# Patient Record
Sex: Male | Born: 1990 | Hispanic: No | Marital: Married | Smoking: Never smoker
Health system: Southern US, Community
[De-identification: ages and names within clinical notes are randomized; demographics above are authoritative.]

## PROBLEM LIST (undated history)

## (undated) DIAGNOSIS — E785 Hyperlipidemia, unspecified: Secondary | ICD-10-CM

## (undated) DIAGNOSIS — S0990XA Unspecified injury of head, initial encounter: Secondary | ICD-10-CM

## (undated) HISTORY — DX: Hyperlipidemia, unspecified: E78.5

## (undated) HISTORY — DX: Unspecified injury of head, initial encounter: S09.90XA

## (undated) HISTORY — PX: OTHER SURGICAL HISTORY: SHX169

---
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
## 2012-02-13 ENCOUNTER — Ambulatory Visit: Payer: Self-pay | Admitting: Physician Assistant

## 2012-02-13 VITALS — BP 124/80 | HR 82 | Temp 98.5°F | Resp 18 | Wt 153.0 lb

## 2012-02-13 DIAGNOSIS — B356 Tinea cruris: Secondary | ICD-10-CM

## 2012-02-13 DIAGNOSIS — R21 Rash and other nonspecific skin eruption: Secondary | ICD-10-CM

## 2012-02-13 LAB — POCT SKIN KOH: Skin KOH, POC: NEGATIVE

## 2012-02-13 MED ORDER — KETOCONAZOLE 2 % EX CREA: TOPICAL_CREAM | Freq: Every day | CUTANEOUS | Status: DC

## 2012-02-13 NOTE — Progress Notes (Signed)
  Subjective:    Patient ID: Mark Lawson, male    DOB: 02/07/1991, 21 y.o.   MRN: [id number]  HPI  Mr. [name] is a 21 yr old male with complaints of a rash in his groin and his right armpit.  Both have been there for approximately six months.  The rash in his armpit is not painful, itching, or spreading.  The rash in his groin started out on the right side and was red and itchy.  He has used OTC jock itch cream with some relief but the lesion has not completely resolved.  It is no longer red, but more of a brown color now.  He has noticed that it is spreading to the left side of his groin.  He denies any systemic symptoms.   Review of Systems  Constitutional: Negative for fever and chills.  Respiratory: Negative for cough and shortness of breath.   Cardiovascular: Negative.   Gastrointestinal: Negative.   Genitourinary: Negative.   Musculoskeletal: Negative.   Skin: Positive for rash (groin).  Neurological: Negative.        Objective:   Physical Exam  Constitutional: He is oriented to person, place, and time. He appears well-developed and well-nourished. No distress.  Neurological: He is alert and oriented to person, place, and time.  Skin: Skin is warm and dry. Rash noted.       Hyperpigmented lesion in the inguinal fold bilaterally, R > L; no vesicals, pustules, scaling, or exudate. Three small striae in the R axilla     Results for orders placed in visit on 02/13/12  POCT SKIN KOH      Component Value Range   Skin KOH, POC Negative          Assessment & Plan:   1. Tinea cruris  ketoconazole (NIZORAL) 2 % cream  2. Rash of groin  POCT Skin KOH   Mr. [name] is a 21 yr old male here with complaints of rash in the groin and axilla.  Reassured patient that the area in his right axilla is a stretch mark, and that this is not dangerous.  KOH skin scraping did not reveal hyphae, but patient has already been treating the lesions with OTC antifungal.  Suspect that this is in fact  tinea cruris and will treat with ketoconazole.  If the area does not improve, patient will RTC for further evaluation.

## 2012-02-13 NOTE — Patient Instructions (Addendum)
  Use the ketoconazole cream until the rash goes away completely, then use for 4-5 days past that.  If the rash comes back, return to clinic before using any other medication.  Jock Itch Jock itch is a fungal infection of the skin in the groin area. It is sometimes called "ringworm" even though it is not caused by a worm. A fungus is a type of germ that thrives in dark, damp places.  CAUSES  This infection may spread from:  A fungus infection elsewhere on the body (such as athlete's foot).  Sharing towels or clothing. This infection is more common in:  Hot, humid climates.  People who wear tight-fitting clothing or wet bathing suits for long periods of time.  Athletes.  Overweight people.  People with diabetes. SYMPTOMS  Jock itch causes the following symptoms:  Red, pink or brown rash in the groin. Rash may spread to the thighs, anus, and buttocks.  Itching. DIAGNOSIS  Your caregiver may make the diagnosis by looking at the rash. Sometimes a skin scraping will be sent to test for fungus. Testing can be done either by looking under the microscope or by doing a culture (test to try to grow the fungus). A culture can take up to 2 weeks to come back. TREATMENT  Jock itch may be treated with:  Skin cream or ointment to kill fungus.  Medicine by mouth to kill fungus.  Skin cream or ointment to calm the itching.  Compresses or medicated powders to dry the infected skin. HOME CARE INSTRUCTIONS   Be sure to treat the rash completely. Follow your caregiver's instructions. It can take a couple of weeks to treat. If you do not treat the infection long enough, the rash can come back.  Wear loose-fitting clothing.  Men should wear cotton boxer shorts.  Women should wear cotton underwear.  Avoid hot baths.  Dry the groin area well after bathing. SEEK MEDICAL CARE IF:   Your rash is worse.  Your rash is spreading.  Your rash returns after treatment is finished.  Your rash  is not gone in 4 weeks. Fungal infections are slow to respond to treatment. Some redness may remain for several weeks after the fungus is gone. SEEK IMMEDIATE MEDICAL CARE IF:  The area becomes red, warm, tender, and swollen.  You have a fever. Document Released: 04/05/2002 Document Revised: 07/08/2011 Document Reviewed: 03/04/2008 [facility] Patient Information 2013 [place], [place].

## 2012-02-14 NOTE — Progress Notes (Signed)
Discussed, reviewed and agree.  

## 2012-08-19 ENCOUNTER — Ambulatory Visit (INDEPENDENT_AMBULATORY_CARE_PROVIDER_SITE_OTHER): Payer: Managed Care, Other (non HMO) | Admitting: Physician Assistant

## 2012-08-19 VITALS — BP 105/63 | HR 58 | Temp 98.2°F | Resp 16 | Ht 67.0 in | Wt 138.0 lb

## 2012-08-19 DIAGNOSIS — Z0000 Encounter for general adult medical examination without abnormal findings: Secondary | ICD-10-CM

## 2012-08-19 LAB — COMPREHENSIVE METABOLIC PANEL
ALT: 15 U/L (ref 0–53)
AST: 15 U/L (ref 0–37)
Albumin: 4.8 g/dL (ref 3.5–5.2)
Alkaline Phosphatase: 61 U/L (ref 39–117)
BUN: 19 mg/dL (ref 6–23)
CO2: 32 mEq/L (ref 19–32)
Calcium: 9.7 mg/dL (ref 8.4–10.5)
Chloride: 99 mEq/L (ref 96–112)
Creat: 0.86 mg/dL (ref 0.50–1.35)
Glucose, Bld: 83 mg/dL (ref 70–99)
Potassium: 4.1 mEq/L (ref 3.5–5.3)
Sodium: 136 mEq/L (ref 135–145)
Total Bilirubin: 0.6 mg/dL (ref 0.3–1.2)
Total Protein: 7.5 g/dL (ref 6.0–8.3)

## 2012-08-19 LAB — POCT CBC
Granulocyte percent: 64.1 %G (ref 37–80)
HCT, POC: 50.9 % (ref 43.5–53.7)
Hemoglobin: 16.9 g/dL (ref 14.1–18.1)
Lymph, poc: 2.9 (ref 0.6–3.4)
MCH, POC: 31.6 pg — AB (ref 27–31.2)
MCHC: 33.2 g/dL (ref 31.8–35.4)
MCV: 95.2 fL (ref 80–97)
MID (cbc): 0.7 (ref 0–0.9)
MPV: 9.8 fL (ref 0–99.8)
POC Granulocyte: 6.4 (ref 2–6.9)
POC LYMPH PERCENT: 29 %L (ref 10–50)
POC MID %: 6.9 %M (ref 0–12)
Platelet Count, POC: 335 10*3/uL (ref 142–424)
RBC: 5.35 M/uL (ref 4.69–6.13)
RDW, POC: 13.8 %
WBC: 10 10*3/uL (ref 4.6–10.2)

## 2012-08-19 LAB — LIPID PANEL
Cholesterol: 159 mg/dL (ref 0–200)
HDL: 57 mg/dL (ref >39)
LDL Cholesterol: 34 mg/dL (ref 0–99)
Total CHOL/HDL Ratio: 2.8 Ratio
Triglycerides: 341 mg/dL — ABNORMAL HIGH (ref <150)
VLDL: 68 mg/dL — ABNORMAL HIGH (ref 0–40)

## 2012-08-19 LAB — TSH: TSH: 1.499 u[IU]/mL (ref 0.350–4.500)

## 2012-08-19 NOTE — Patient Instructions (Addendum)
Call to make appointment with Dermatology [facility] [phone]

## 2012-08-19 NOTE — Progress Notes (Signed)
  Subjective:    Patient ID: Mark Lawson, male    DOB: March 16, 1991, 22 y.o.   MRN: [id number]  HPI 22 year old male presents for complete physical exam.  He is here for screening labs to make sure "everything is ok."  Has no specific concerns today other than a rash in the groin area that has been there "for about a year."  Was seen in 01/2012 for evaluation of this and treated for tinea cruris with ketoconazole. He states that did not seem to help at all. This rash is not itchy or irritating at all. Overall does not bother him.       Review of Systems  Constitutional: Negative.   HENT: Negative.   Eyes: Negative.   Respiratory: Negative.   Cardiovascular: Negative.   Gastrointestinal: Negative.   Endocrine: Negative.   Genitourinary: Negative.   Musculoskeletal: Negative.   Skin: Positive for rash.  Allergic/Immunologic: Negative.   Neurological: Negative.   Hematological: Negative.   Psychiatric/Behavioral: Negative.        Objective:   Physical Exam  Constitutional: He is oriented to person, place, and time. He appears well-developed and well-nourished.  HENT:  Head: Normocephalic and atraumatic.  Right Ear: Hearing, tympanic membrane, external ear and ear canal normal.  Left Ear: Hearing, tympanic membrane, external ear and ear canal normal.  Mouth/Throat: Uvula is midline, oropharynx is clear and moist and mucous membranes are normal. No oropharyngeal exudate.  Eyes: Conjunctivae and EOM are normal. Pupils are equal, round, and reactive to light.  Neck: Normal range of motion. Neck supple. No thyromegaly present.  Cardiovascular: Normal rate, regular rhythm and normal heart sounds.   Pulmonary/Chest: Effort normal and breath sounds normal.  Abdominal: Soft. Bowel sounds are normal. There is no tenderness. There is no rebound and no guarding.  Lymphadenopathy:    He has no cervical adenopathy.  Neurological: He is alert and oriented to person, place, and time.  Skin:      Psychiatric: He has a normal mood and affect. His behavior is normal. Judgment and thought content normal.     Results for orders placed in visit on 08/19/12  POCT CBC      Result Value Range   WBC 10.0  4.6 - 10.2 K/uL   Lymph, poc 2.9  0.6 - 3.4   POC LYMPH PERCENT 29.0  10 - 50 %L   MID (cbc) 0.7  0 - 0.9   POC MID % 6.9  0 - 12 %M   POC Granulocyte 6.4  2 - 6.9   Granulocyte percent 64.1  37 - 80 %G   RBC 5.35  4.69 - 6.13 M/uL   Hemoglobin 16.9  14.1 - 18.1 g/dL   HCT, POC 40.9  81.1 - 53.7 %   MCV 95.2  80 - 97 fL   MCH, POC 31.6 (*) 27 - 31.2 pg   MCHC 33.2  31.8 - 35.4 g/dL   RDW, POC 91.4     Platelet Count, POC 335  142 - 424 K/uL   MPV 9.8  0 - 99.8 fL        Assessment & Plan:  Routine general medical examination at a health care facility - Plan: POCT CBC, Comprehensive metabolic panel, Lipid panel, TSH  Labs pending Rash likely post-inflammatory hyperpigmentation.  Patient unwilling to try watchful waiting. Will refer to Dermatology for further evaluation and treatment.

## 2012-08-29 ENCOUNTER — Ambulatory Visit (INDEPENDENT_AMBULATORY_CARE_PROVIDER_SITE_OTHER): Payer: Managed Care, Other (non HMO) | Admitting: Physician Assistant

## 2012-08-29 VITALS — BP 121/73 | HR 79 | Temp 98.2°F | Resp 17 | Ht 66.5 in | Wt 140.0 lb

## 2012-08-29 DIAGNOSIS — J309 Allergic rhinitis, unspecified: Secondary | ICD-10-CM

## 2012-08-29 MED ORDER — FLUTICASONE PROPIONATE 50 MCG/ACT NA SUSP: 2.0000 | Freq: Every day | NASAL | Status: DC

## 2012-08-29 MED ORDER — CETIRIZINE HCL 10 MG PO TABS: 10.0000 mg | ORAL_TABLET | Freq: Every day | ORAL | Status: DC

## 2012-08-29 NOTE — Progress Notes (Signed)
  Subjective:    Patient ID: Jamespaul Secrist, male    DOB: 06/11/1990, 21 y.o.   MRN: [id number]  HPI   Mr. [name] is a pleasant 22 yr old male here due to allergies.  States he has experienced burning/itchy throat, runny nose, nasal congestion/difficulty breathing through the nose.  Also with itchy, watery eyes.  Denies cough, wheezing, SOB.  Has had allergy symptoms in the past but feels like they are worse this year.  Worse in the past couple days.  Was unable to go to work yesterday due to symptoms.  Tried Benadryl but this made him feel dizzy and sleepy.  Denies HA or fever.     Review of Systems  Constitutional: Negative for fever and chills.  HENT: Positive for congestion, sore throat, rhinorrhea and postnasal drip. Negative for ear pain, neck pain and neck stiffness.   Respiratory: Negative for cough, shortness of breath and wheezing.   Cardiovascular: Negative.   Gastrointestinal: Negative.   Allergic/Immunologic: Positive for environmental allergies.  Neurological: Negative.        Objective:   Physical Exam  Vitals reviewed. Constitutional: He is oriented to person, place, and time. He appears well-developed and well-nourished. No distress.  HENT:  Head: Normocephalic and atraumatic.  Right Ear: Tympanic membrane and ear canal normal.  Left Ear: Tympanic membrane and ear canal normal.  Nose: Mucosal edema (turbinates pale) and rhinorrhea present. Right sinus exhibits no maxillary sinus tenderness and no frontal sinus tenderness. Left sinus exhibits no maxillary sinus tenderness and no frontal sinus tenderness.  Mouth/Throat: Uvula is midline, oropharynx is clear and moist and mucous membranes are normal.  Post-nasal drainage  Eyes: Conjunctivae are normal. Right eye exhibits no discharge. Left eye exhibits no discharge. No scleral icterus.  Neck: Neck supple.  Cardiovascular: Normal rate, regular rhythm and normal heart sounds.  Exam reveals no gallop and no friction rub.   No  murmur heard. Pulmonary/Chest: Effort normal and breath sounds normal. He has no wheezes. He has no rales.  Lymphadenopathy:    He has no cervical adenopathy.  Neurological: He is alert and oriented to person, place, and time.  Skin: Skin is warm and dry.  Psychiatric: He has a normal mood and affect. His behavior is normal.     Filed Vitals:   08/29/12 1215  BP: 121/73  Pulse: 79  Temp: 98.2 F (36.8 C)  Resp: 17        Assessment & Plan:  Allergic rhinitis - Plan: fluticasone (FLONASE) 50 MCG/ACT nasal spray, cetirizine (ZYRTEC) 10 MG tablet   Mr. [name] is a pleasant 22 yr old male with allergic rhinitis.  Will treat with Flonase and Zyrtec.  Discussed the importance of consistent use of medications for best results.  Discussed possibility of adding an eye drop if eye symptoms are not well-controlled with this regimen.  Work note provided for yesterday.  Discussed RTC precautions.  Patient understands and is in agreement with this plan.

## 2012-08-29 NOTE — Patient Instructions (Addendum)
Begin using Flonase (fluticasone) every day - this medicine works best with consistent, daily use.  Also begin using Zyrtec (cetirizine) once daily by mouth - this lasts 24 hours.   If your eye symptoms are not improving, let me know and we can send an eye drop for you.   Allergic Rhinitis Allergic rhinitis is when the mucous membranes in the nose respond to allergens. Allergens are particles in the air that cause your body to have an allergic reaction. This causes you to release allergic antibodies. Through a chain of events, these eventually cause you to release histamine into the blood stream (hence the use of antihistamines). Although meant to be protective to the body, it is this release that causes your discomfort, such as frequent sneezing, congestion and an itchy runny nose.  CAUSES  The pollen allergens may come from grasses, trees, and weeds. This is seasonal allergic rhinitis, or "hay fever." Other allergens cause year-round allergic rhinitis (perennial allergic rhinitis) such as house dust mite allergen, pet dander and mold spores.  SYMPTOMS   Nasal stuffiness (congestion).  Runny, itchy nose with sneezing and tearing of the eyes.  There is often an itching of the mouth, eyes and ears. It cannot be cured, but it can be controlled with medications. DIAGNOSIS  If you are unable to determine the offending allergen, skin or blood testing may find it. TREATMENT   Avoid the allergen.  Medications and allergy shots (immunotherapy) can help.  Hay fever may often be treated with antihistamines in pill or nasal spray forms. Antihistamines block the effects of histamine. There are over-the-counter medicines that may help with nasal congestion and swelling around the eyes. Check with your caregiver before taking or giving this medicine. If the treatment above does not work, there are many new medications your caregiver can prescribe. Stronger medications may be used if initial measures are  ineffective. Desensitizing injections can be used if medications and avoidance fails. Desensitization is when a patient is given ongoing shots until the body becomes less sensitive to the allergen. Make sure you follow up with your caregiver if problems continue. SEEK MEDICAL CARE IF:   You develop fever (more than 100.5 F (38.1 C).  You develop a cough that does not stop easily (persistent).  You have shortness of breath.  You start wheezing.  Symptoms interfere with normal daily activities. Document Released: 01/08/2001 Document Revised: 07/08/2011 Document Reviewed: 07/20/2008 [facility] Patient Information 2013 [place], [place].

## 2013-02-27 ENCOUNTER — Ambulatory Visit (INDEPENDENT_AMBULATORY_CARE_PROVIDER_SITE_OTHER): Payer: Managed Care, Other (non HMO) | Admitting: Family Medicine

## 2013-02-27 VITALS — BP 124/64 | HR 65 | Temp 98.1°F | Resp 14 | Ht 68.0 in | Wt 155.0 lb

## 2013-02-27 DIAGNOSIS — R42 Dizziness and giddiness: Secondary | ICD-10-CM

## 2013-02-27 DIAGNOSIS — R51 Headache: Secondary | ICD-10-CM

## 2013-02-27 MED ORDER — MECLIZINE HCL 25 MG PO TABS: 25.0000 mg | ORAL_TABLET | Freq: Three times a day (TID) | ORAL | Status: DC | PRN

## 2013-02-27 MED ORDER — TRAMADOL HCL 50 MG PO TABS: 50.0000 mg | ORAL_TABLET | Freq: Four times a day (QID) | ORAL | Status: DC | PRN

## 2013-02-27 NOTE — Patient Instructions (Signed)
Take Tylenol or ibuprofen for mild headaches.  For severe headache take tramadol one every 6 hours as needed. It can be taken with the other medications as above if needed.  Take the meclizine one every 6 or 8 hours if needed for dizziness. It will make you drowsy.  Rest this weekend and try and get lots of sleep.  If abruptly worse at anytime go to the emergency room or come back in here  If symptoms continue to persist over the next 4 or 5 days come back for a recheck.

## 2013-02-27 NOTE — Progress Notes (Signed)
Subjective: 22 year old male who works at the [employer]. The last 2 days he has been having problems with a generalized headache and dizziness. It started on Thursday morning when he got up and he was dizzy. He worked for Thursday, but was unable to work on Friday. He's not been taking anything for his symptoms. He has not had any nausea or vomiting or fever. He has had problems with some headache in the past. A couple of years ago he hit his head and went to the emergency room with hip pain. A CT scan was done which was normal at that time except for a little sinusitis. He is not on any regular medications, and is generally healthy. No ringing in the ears.   Objective Healthy-appearing young man. His TMs are normal. Eyes PERRLA. EOMs intact. Fundi benign with discs clear. Throat clear. Neck supple without nodes. No carotid bruits. Cranial nerves 2-12 grossly intact. Motor strength symmetrical. Finger to nose rapid alternating movement normal. Tandem heel toe walk normal. Romberg negative. No palmar drift.  Assessment: Nonspecific headache and dizziness  Plan: Treat symptomatically. If symptoms do not improve he is to return. In the event of acute worsening he is to either coming here or go to the emergency room.

## 2013-07-30 ENCOUNTER — Ambulatory Visit (INDEPENDENT_AMBULATORY_CARE_PROVIDER_SITE_OTHER): Payer: Managed Care, Other (non HMO) | Admitting: Internal Medicine

## 2013-07-30 VITALS — BP 118/80 | HR 64 | Temp 98.6°F | Resp 18 | Ht 68.0 in | Wt 156.0 lb

## 2013-07-30 DIAGNOSIS — J309 Allergic rhinitis, unspecified: Secondary | ICD-10-CM

## 2013-07-30 DIAGNOSIS — J329 Chronic sinusitis, unspecified: Secondary | ICD-10-CM

## 2013-07-30 MED ORDER — FLUTICASONE PROPIONATE 50 MCG/ACT NA SUSP: 2.0000 | Freq: Every day | NASAL | Status: DC

## 2013-07-30 MED ORDER — PREDNISONE 10 MG PO TABS: ORAL_TABLET | ORAL | Status: DC

## 2013-07-30 MED ORDER — CETIRIZINE HCL 10 MG PO TABS
10.0000 mg | ORAL_TABLET | Freq: Every day | ORAL | Status: DC
Start: 2013-07-30 — End: 2013-10-05

## 2013-07-30 NOTE — Progress Notes (Signed)
   Subjective:    Patient ID: Mark Lawson, male    DOB: Aug 07, 1990, 23 y.o.   MRN: [id number]  HPI Patient presents with cough, sore throat, sinus headache,both ears feel clogged, and generally doesn't feel good since Wednesday.  Patient denies nausea, vomiting, or diarrhea. Has spring and summer allergys, nose is swollen, clear disch.  Review of Systems     Objective:   Physical Exam  Vitals reviewed. Constitutional: He is oriented to person, place, and time. He appears well-developed and well-nourished. He appears distressed.  HENT:  Head: Normocephalic.  Right Ear: External ear normal.  Left Ear: External ear normal.  Nose: Mucosal edema, rhinorrhea and sinus tenderness present. Right sinus exhibits no maxillary sinus tenderness and no frontal sinus tenderness. Left sinus exhibits no maxillary sinus tenderness and no frontal sinus tenderness.  Mouth/Throat: Oropharynx is clear and moist.  Eyes: EOM are normal. Pupils are equal, round, and reactive to light.  Neck: Normal range of motion. Neck supple.  Cardiovascular: Normal rate.   Pulmonary/Chest: Effort normal and breath sounds normal.  Neurological: He is alert and oriented to person, place, and time. No cranial nerve deficit. He exhibits normal muscle tone. Coordination normal.  Psychiatric: He has a normal mood and affect. His behavior is normal.          Assessment & Plan:  Allergic rhinitis/HA//early sinusitis Fluticasone nasal/cetirizine 10mg /Prednisone 6d taper

## 2013-07-30 NOTE — Patient Instructions (Signed)
Allergic Rhinitis Allergic rhinitis is when the mucous membranes in the nose respond to allergens. Allergens are particles in the air that cause your body to have an allergic reaction. This causes you to release allergic antibodies. Through a chain of events, these eventually cause you to release histamine into the blood stream. Although meant to protect the body, it is this release of histamine that causes your discomfort, such as frequent sneezing, congestion, and an itchy, runny nose.  CAUSES  Seasonal allergic rhinitis (hay fever) is caused by pollen allergens that may come from grasses, trees, and weeds. Year-round allergic rhinitis (perennial allergic rhinitis) is caused by allergens such as house dust mites, pet dander, and mold spores.  SYMPTOMS   Nasal stuffiness (congestion).  Itchy, runny nose with sneezing and tearing of the eyes. DIAGNOSIS  Your health care provider can help you determine the allergen or allergens that trigger your symptoms. If you and your health care provider are unable to determine the allergen, skin or blood testing may be used. TREATMENT  Allergic Rhinitis does not have a cure, but it can be controlled by:  Medicines and allergy shots (immunotherapy).  Avoiding the allergen. Hay fever may often be treated with antihistamines in pill or nasal spray forms. Antihistamines block the effects of histamine. There are over-the-counter medicines that may help with nasal congestion and swelling around the eyes. Check with your health care provider before taking or giving this medicine.  If avoiding the allergen or the medicine prescribed do not work, there are many new medicines your health care provider can prescribe. Stronger medicine may be used if initial measures are ineffective. Desensitizing injections can be used if medicine and avoidance does not work. Desensitization is when a patient is given ongoing shots until the body becomes less sensitive to the allergen.  Make sure you follow up with your health care provider if problems continue. HOME CARE INSTRUCTIONS It is not possible to completely avoid allergens, but you can reduce your symptoms by taking steps to limit your exposure to them. It helps to know exactly what you are allergic to so that you can avoid your specific triggers. SEEK MEDICAL CARE IF:   You have a fever.  You develop a cough that does not stop easily (persistent).  You have shortness of breath.  You start wheezing.  Symptoms interfere with normal daily activities. Document Released: 01/08/2001 Document Revised: 02/03/2013 Document Reviewed: 12/21/2012 [employer] Patient Information 2014 [employer].  

## 2013-10-05 ENCOUNTER — Ambulatory Visit (INDEPENDENT_AMBULATORY_CARE_PROVIDER_SITE_OTHER): Payer: Managed Care, Other (non HMO)

## 2013-10-05 ENCOUNTER — Ambulatory Visit (INDEPENDENT_AMBULATORY_CARE_PROVIDER_SITE_OTHER): Payer: Managed Care, Other (non HMO) | Admitting: Family Medicine

## 2013-10-05 VITALS — BP 100/60 | HR 64 | Temp 99.0°F | Ht 66.0 in | Wt 150.0 lb

## 2013-10-05 DIAGNOSIS — R0609 Other forms of dyspnea: Secondary | ICD-10-CM

## 2013-10-05 DIAGNOSIS — R0989 Other specified symptoms and signs involving the circulatory and respiratory systems: Secondary | ICD-10-CM

## 2013-10-05 DIAGNOSIS — R1011 Right upper quadrant pain: Secondary | ICD-10-CM

## 2013-10-05 DIAGNOSIS — R0600 Dyspnea, unspecified: Secondary | ICD-10-CM

## 2013-10-05 DIAGNOSIS — Z0000 Encounter for general adult medical examination without abnormal findings: Secondary | ICD-10-CM

## 2013-10-05 DIAGNOSIS — B356 Tinea cruris: Secondary | ICD-10-CM

## 2013-10-05 DIAGNOSIS — B351 Tinea unguium: Secondary | ICD-10-CM

## 2013-10-05 DIAGNOSIS — E781 Pure hyperglyceridemia: Secondary | ICD-10-CM | POA: Insufficient documentation

## 2013-10-05 LAB — POCT CBC
GRANULOCYTE PERCENT: 53.1 % (ref 37–80)
HCT, POC: 45.1 % (ref 43.5–53.7)
Hemoglobin: 14.4 g/dL (ref 14.1–18.1)
Lymph, poc: 2.7 (ref 0.6–3.4)
MCH, POC: 30.6 pg (ref 27–31.2)
MCHC: 31.9 g/dL (ref 31.8–35.4)
MCV: 95.7 fL (ref 80–97)
MID (CBC): 0.5 (ref 0–0.9)
MPV: 9.2 fL (ref 0–99.8)
PLATELET COUNT, POC: 304 10*3/uL (ref 142–424)
POC GRANULOCYTE: 3.7 (ref 2–6.9)
POC LYMPH %: 39.1 % (ref 10–50)
POC MID %: 7.8 % (ref 0–12)
RBC: 4.71 M/uL (ref 4.69–6.13)
RDW, POC: 14.1 %
WBC: 6.9 10*3/uL (ref 4.6–10.2)

## 2013-10-05 MED ORDER — TERBINAFINE HCL 250 MG PO TABS: 250.0000 mg | ORAL_TABLET | Freq: Every day | ORAL | Status: DC

## 2013-10-05 NOTE — Patient Instructions (Signed)
Take Lamisil one daily for rash and toenails. Return in one month for me to recheck things and I will probably continue the medication on for 2 more months after that.  Use the inhaler 2 inhalations when you have a shortness of breath episode

## 2013-10-05 NOTE — Progress Notes (Signed)
  Physical exam:  History: Patient is here for physical exam. He has several complaints. He's been waking up occasionally at night and going to the bathroom and developing shortness of breath. He hurts in his right upper abdomen when he does this. He stays up for a little while drink some water and feels better goes back to sleep. He's not had episodes of shortness of breath the day time. This been having for some time apparently. He does not describe wheezing with it. No coughing or congestion. He also been having problems with his jock itch never gone away quite completely. He is concerned about it, and the hyperpigmentation of the skin where they jock itch subsided. Lastly he has some dystrophy of his toenails developing and he would like to check his nails.  Past medical history: Operations: He had surgery on his head when he had an injury as a child. He's not sure exactly what to do. Medical illnesses: None Allergies: None Regular medications: None   Social history: Works at the [employer]. Plans to go back to school this fall. 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 Single, but has had the same girlfriend for a year. Is sexually involved and would like STD testing done. Does not smoke, drink, or use drugs  Family history: Unremarkable. Parents and siblings are living and well  Review of systems: Constitutional: Unremarkable Respiratory: As above Cardiovascular: Unremarkable Gastrointestinal: As above Genitourinary: Unremarkable Neurologic: Unremarkable Musculoskeletal l: Unremarkable Psychiatric: Unremarkable Dermatologic: Unremarkable Endocrinologic: Unremarkable Immunologic: Unremarkable   Physical exam: Well-developed well-nourished young man in no acute distress. TMs normal. Eyes PERRLA. Fundi benign. Throat clear. Neck supple without nodes or thyromegaly. No carotid bruits. Chest clear. Heart regular without murmurs. And soft without mass or tenderness. Normal male  external genitalia without hernias. Extremities unremarkable. He has some jock itch in his groin region. He has some mild dystrophy of his nails and some discoloration of some of the nails.  Assessment: Physical exam Tinea cruris Onychomycosis Dyspnea episodes, undetermined cause Abdominal pain related to this dyspnea, possibly gas trapping in the intestine causing him his symptoms.  history hypertriglyceridemia  Plan: See instructions Treat with Lamisil and have him come back in one month to recheck labs and see how the rash is doing. [facility] reading (PRIMARY) by  Dr. Alwyn Ren Normal chest  Results for orders placed in visit on 10/05/13  POCT CBC      Result Value Ref Range   WBC 6.9  4.6 - 10.2 K/uL   Lymph, poc 2.7  0.6 - 3.4   POC LYMPH PERCENT 39.1  10 - 50 %L   MID (cbc) 0.5  0 - 0.9   POC MID % 7.8  0 - 12 %M   POC Granulocyte 3.7  2 - 6.9   Granulocyte percent 53.1  37 - 80 %G   RBC 4.71  4.69 - 6.13 M/uL   Hemoglobin 14.4  14.1 - 18.1 g/dL   HCT, POC 22.9  79.8 - 53.7 %   MCV 95.7  80 - 97 fL   MCH, POC 30.6  27 - 31.2 pg   MCHC 31.9  31.8 - 35.4 g/dL   RDW, POC 92.1     Platelet Count, POC 304  142 - 424 K/uL   MPV 9.2  0 - 99.8 fL   .

## 2013-10-06 ENCOUNTER — Telehealth: Payer: Self-pay

## 2013-10-06 LAB — LIPID PANEL
CHOLESTEROL: 198 mg/dL (ref 0–200)
HDL: 71 mg/dL (ref >39)
LDL Cholesterol: 101 mg/dL — ABNORMAL HIGH (ref 0–99)
TRIGLYCERIDES: 130 mg/dL (ref <150)
Total CHOL/HDL Ratio: 2.8 Ratio
VLDL: 26 mg/dL (ref 0–40)

## 2013-10-06 LAB — COMPREHENSIVE METABOLIC PANEL
ALBUMIN: 4.9 g/dL (ref 3.5–5.2)
ALK PHOS: 52 U/L (ref 39–117)
ALT: 20 U/L (ref 0–53)
AST: 20 U/L (ref 0–37)
BUN: 15 mg/dL (ref 6–23)
CALCIUM: 9.9 mg/dL (ref 8.4–10.5)
CHLORIDE: 102 meq/L (ref 96–112)
CO2: 28 mEq/L (ref 19–32)
Creat: 0.8 mg/dL (ref 0.50–1.35)
Glucose, Bld: 92 mg/dL (ref 70–99)
Potassium: 4.3 mEq/L (ref 3.5–5.3)
SODIUM: 137 meq/L (ref 135–145)
TOTAL PROTEIN: 7.6 g/dL (ref 6.0–8.3)
Total Bilirubin: 0.5 mg/dL (ref 0.2–1.2)

## 2013-10-06 LAB — RPR

## 2013-10-06 LAB — HIV ANTIBODY (ROUTINE TESTING W REFLEX): HIV 1&2 Ab, 4th Generation: NONREACTIVE

## 2013-10-06 MED ORDER — ALBUTEROL SULFATE HFA 108 (90 BASE) MCG/ACT IN AERS: 2.0000 | INHALATION_SPRAY | Freq: Four times a day (QID) | RESPIRATORY_TRACT | Status: DC | PRN

## 2013-10-06 NOTE — Telephone Encounter (Signed)
Inhaler sent to the pharmacy. Pt advised.

## 2013-10-06 NOTE — Telephone Encounter (Signed)
Patient called and wants to leave a message for Dr. Alwyn Ren. States Dr. Alwyn Ren prescribed him an inhaler but it was never sent to the pharmacy. Please return call and advise.

## 2013-10-06 NOTE — Telephone Encounter (Signed)
Which inhaler did you want to prescribe to the pt?

## 2013-10-06 NOTE — Telephone Encounter (Signed)
Albuterol inhaler.  Use 2 puffs q6 h prn. 1 with 1 refill

## 2013-10-07 LAB — GC/CHLAMYDIA PROBE AMP
CT Probe RNA: NEGATIVE
GC Probe RNA: NEGATIVE

## 2014-03-02 ENCOUNTER — Ambulatory Visit (INDEPENDENT_AMBULATORY_CARE_PROVIDER_SITE_OTHER): Payer: Managed Care, Other (non HMO) | Admitting: Family Medicine

## 2014-03-02 VITALS — BP 122/72 | HR 86 | Temp 98.3°F | Resp 17 | Ht 66.0 in | Wt 152.0 lb

## 2014-03-02 DIAGNOSIS — M549 Dorsalgia, unspecified: Secondary | ICD-10-CM

## 2014-03-02 DIAGNOSIS — M546 Pain in thoracic spine: Secondary | ICD-10-CM

## 2014-03-02 DIAGNOSIS — S46912A Strain of unspecified muscle, fascia and tendon at shoulder and upper arm level, left arm, initial encounter: Secondary | ICD-10-CM

## 2014-03-02 DIAGNOSIS — B351 Tinea unguium: Secondary | ICD-10-CM

## 2014-03-02 MED ORDER — NAPROXEN 500 MG PO TABS: 500.0000 mg | ORAL_TABLET | Freq: Two times a day (BID) | ORAL | Status: DC

## 2014-03-02 MED ORDER — TERBINAFINE HCL 250 MG PO TABS: 250.0000 mg | ORAL_TABLET | Freq: Every day | ORAL | Status: DC

## 2014-03-02 MED ORDER — METHOCARBAMOL 750 MG PO TABS: ORAL_TABLET | ORAL | Status: DC

## 2014-03-02 NOTE — Progress Notes (Signed)
Subjective: 23 year old male well-known to me from previous encounters. The patient was at work yesterday. He went home and was sitting in a chair and when he got up his left shoulder was having extreme pain. He decided it through last night, and this morning when he got up it hurt again badly. He did go on to work but had to leave early due to the pain. He did not know of any specific injury. 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  He also has questions about his toenails. The one month of medication started helping the fungus, and he did not come back in. It seems to be getting a little worse again.  Objective: Tender in the left scapular region, most tender just below and medial to the scapula. Good range of motion of his shoulder that hurts him when he turns his neck. Grip is good. Pulse good.  He does have fungus of his nails still, partially treated  Assessment: Left upper back pain Left scapular area strain Onychomycosis  Plan: Robaxin and naproxen for his back Refill the terbinafine Estimated to come back again in 1 month for me to recheck his toenails.

## 2014-03-02 NOTE — Patient Instructions (Signed)
Take the naproxen one twice daily at breakfast and supper  Take the Robaxin (methocarbamol) 1 at breakfast, one at lunch, one at supper, and 2 at bedtime for muscle relaxant  Resumed taking the terbinafine one daily for one month.  If the back is not doing much better by the first part of the week please return for a recheck  Avoid heavy lifting and straining  Apply ice for 5 times daily to the back as discussed  Return in about one month for me to recheck your toenail and decide whether need to continue the medication longer.

## 2014-07-14 ENCOUNTER — Ambulatory Visit (INDEPENDENT_AMBULATORY_CARE_PROVIDER_SITE_OTHER): Payer: Managed Care, Other (non HMO) | Admitting: Sports Medicine

## 2014-07-14 VITALS — BP 112/78 | HR 64 | Temp 98.0°F | Resp 20 | Ht 67.0 in | Wt 156.5 lb

## 2014-07-14 DIAGNOSIS — J0110 Acute frontal sinusitis, unspecified: Secondary | ICD-10-CM

## 2014-07-14 DIAGNOSIS — J309 Allergic rhinitis, unspecified: Secondary | ICD-10-CM

## 2014-07-14 MED ORDER — FLUTICASONE PROPIONATE 50 MCG/ACT NA SUSP: 2.0000 | Freq: Every day | NASAL | Status: DC

## 2014-07-14 MED ORDER — CETIRIZINE HCL 10 MG PO TABS: 10.0000 mg | ORAL_TABLET | Freq: Every day | ORAL | Status: DC

## 2014-07-14 NOTE — Progress Notes (Signed)
  Mark Lawson - 24 y.o. male MRN [id number]  Date of birth: May 19, 1990  SUBJECTIVE: Chief Complaint  Patient presents with  . Headache    x 2 days  . Dizziness    HPI:  Slow insidious onset of the above symptoms.  Reports mainly bilateral frontal pressure left, greater than right.  Taking Tylenol PRN without significant improvement, no other medications  . Reports dysphoric feeling more so than vertiginous feeling.  No changes in hearing.  Reports nasal congestion and runny nose.  Does not awaken him at night.  No fevers, chills, cough, congestion, difficulty breathing.  No tachypalpitations, or orthostasis.  No vision changes  No unilateral weakness.     ROS: per HPI    HISTORY:  Past Medical, Surgical, Social, and Family History reviewed & updated per EMR.  Pertinent Historical Findings include:  reports that he has never smoked. He has never used smokeless tobacco. Otherwise healthy Previously  Taking allergy medication.  OBJECTIVE:  VS:   HT:5\' 7"  (170.2 cm)   WT:156 lb 8 oz (70.988 kg)  BMI:24.6          BP:112/78 mmHg  HR:64bpm  TEMP:98 F (36.7 C)(Oral)  RESP:99 %  Physical Exam  Constitutional: He is well-developed, well-nourished, and in no distress. No distress.  HENT:  Head: Normocephalic and atraumatic.  Right Ear: External ear normal.  Left Ear: External ear normal.  Nose: Mucosal edema (Nasal polyps) and rhinorrhea present. Right sinus exhibits maxillary sinus tenderness. Right sinus exhibits no frontal sinus tenderness. Left sinus exhibits maxillary sinus tenderness. Left sinus exhibits no frontal sinus tenderness.  Mouth/Throat: Uvula is midline. Mucous membranes are pale. No oral lesions. Normal dentition. No uvula swelling. Posterior oropharyngeal erythema present. No oropharyngeal exudate or posterior oropharyngeal edema.  Eyes: Right eye exhibits no discharge. Left eye exhibits no discharge. No scleral icterus.  Neck: No JVD  present. No tracheal deviation present. No thyromegaly present.  Cardiovascular: Normal rate, regular rhythm and normal heart sounds.  Exam reveals no gallop and no friction rub.   No murmur heard. Pulmonary/Chest: Effort normal and breath sounds normal. No respiratory distress. He has no wheezes. He exhibits no tenderness.  Abdominal: Soft.  Musculoskeletal: He exhibits no edema or tenderness.  Neurological: He is alert.  Moves all 4 extremities spontaneously; no lateralization.  Skin: Skin is warm and dry. He is not diaphoretic.  Psychiatric: Mood, memory, affect and judgment normal.  Vitals reviewed.    ASSESSMENT: 1. Allergic rhinitis, unspecified allergic rhinitis type   2. Acute frontal sinusitis, recurrence not specified     PLAN: See problem based charting & AVS for additional documentation. > Return if symptoms worsen or fail to improve.   Meds ordered this encounter  Medications  . fluticasone (FLONASE) 50 MCG/ACT nasal spray    Sig: Place 2 sprays into both nostrils daily.    Dispense:  16 g    Refill:  6  . cetirizine (ZYRTEC) 10 MG tablet    Sig: Take 1 tablet (10 mg total) by mouth daily.    Dispense:  30 tablet    Refill:  11    No orders of the defined types were placed in this encounter.

## 2014-07-14 NOTE — Patient Instructions (Signed)
Use the flonase the way we discussed.  Cross your arms and breathe in and spray using the Flonase. 2 Sprays on each side Don't blow your nose for 30 minutes after you use it.  Allergic Rhinitis Allergic rhinitis is when the mucous membranes in the nose respond to allergens. Allergens are particles in the air that cause your body to have an allergic reaction. This causes you to release allergic antibodies. Through a chain of events, these eventually cause you to release histamine into the blood stream. Although meant to protect the body, it is this release of histamine that causes your discomfort, such as frequent sneezing, congestion, and an itchy, runny nose.  CAUSES  Seasonal allergic rhinitis (hay fever) is caused by pollen allergens that may come from grasses, trees, and weeds. Year-round allergic rhinitis (perennial allergic rhinitis) is caused by allergens such as house dust mites, pet dander, and mold spores.  SYMPTOMS   Nasal stuffiness (congestion).  Itchy, runny nose with sneezing and tearing of the eyes. DIAGNOSIS  Your health care provider can help you determine the allergen or allergens that trigger your symptoms. If you and your health care provider are unable to determine the allergen, skin or blood testing may be used. TREATMENT  Allergic rhinitis does not have a cure, but it can be controlled by:  Medicines and allergy shots (immunotherapy).  Avoiding the allergen. Hay fever may often be treated with antihistamines in pill or nasal spray forms. Antihistamines block the effects of histamine. There are over-the-counter medicines that may help with nasal congestion and swelling around the eyes. Check with your health care provider before taking or giving this medicine.  If avoiding the allergen or the medicine prescribed do not work, there are many new medicines your health care provider can prescribe. Stronger medicine may be used if initial measures are ineffective.  Desensitizing injections can be used if medicine and avoidance does not work. Desensitization is when a patient is given ongoing shots until the body becomes less sensitive to the allergen. Make sure you follow up with your health care provider if problems continue. HOME CARE INSTRUCTIONS It is not possible to completely avoid allergens, but you can reduce your symptoms by taking steps to limit your exposure to them. It helps to know exactly what you are allergic to so that you can avoid your specific triggers. SEEK MEDICAL CARE IF:   You have a fever.  You develop a cough that does not stop easily (persistent).  You have shortness of breath.  You start wheezing.  Symptoms interfere with normal daily activities. Document Released: 01/08/2001 Document Revised: 04/20/2013 Document Reviewed: 12/21/2012 [facility] Patient Information 2015 [employer] [place]. This information is not intended to replace advice given to you by your health care provider. Make sure you discuss any questions you have with your health care provider.

## 2014-12-14 ENCOUNTER — Ambulatory Visit (INDEPENDENT_AMBULATORY_CARE_PROVIDER_SITE_OTHER): Payer: Managed Care, Other (non HMO) | Admitting: Physician Assistant

## 2014-12-14 VITALS — BP 110/74 | HR 65 | Temp 98.3°F | Resp 18 | Ht 66.0 in | Wt 153.0 lb

## 2014-12-14 DIAGNOSIS — Z0000 Encounter for general adult medical examination without abnormal findings: Secondary | ICD-10-CM | POA: Diagnosis not present

## 2014-12-14 DIAGNOSIS — M539 Dorsopathy, unspecified: Secondary | ICD-10-CM | POA: Diagnosis not present

## 2014-12-14 DIAGNOSIS — Z1329 Encounter for screening for other suspected endocrine disorder: Secondary | ICD-10-CM | POA: Diagnosis not present

## 2014-12-14 DIAGNOSIS — Z13220 Encounter for screening for lipoid disorders: Secondary | ICD-10-CM | POA: Diagnosis not present

## 2014-12-14 DIAGNOSIS — Z130 Encounter for screening for diseases of the blood and blood-forming organs and certain disorders involving the immune mechanism: Secondary | ICD-10-CM | POA: Diagnosis not present

## 2014-12-14 DIAGNOSIS — Z113 Encounter for screening for infections with a predominantly sexual mode of transmission: Secondary | ICD-10-CM

## 2014-12-14 DIAGNOSIS — Z131 Encounter for screening for diabetes mellitus: Secondary | ICD-10-CM | POA: Diagnosis not present

## 2014-12-14 LAB — CBC
HEMATOCRIT: 46.3 % (ref 39.0–52.0)
HEMOGLOBIN: 16.5 g/dL (ref 13.0–17.0)
MCH: 32.4 pg (ref 26.0–34.0)
MCHC: 35.6 g/dL (ref 30.0–36.0)
MCV: 90.8 fL (ref 78.0–100.0)
MPV: 9.9 fL (ref 8.6–12.4)
Platelets: 286 10*3/uL (ref 150–400)
RBC: 5.1 MIL/uL (ref 4.22–5.81)
RDW: 13.2 % (ref 11.5–15.5)
WBC: 7 10*3/uL (ref 4.0–10.5)

## 2014-12-14 LAB — LIPID PANEL
CHOLESTEROL: 174 mg/dL (ref 125–200)
HDL: 69 mg/dL (ref >=40)
LDL Cholesterol: 93 mg/dL (ref <130)
TRIGLYCERIDES: 61 mg/dL (ref <150)
Total CHOL/HDL Ratio: 2.5 Ratio (ref <=5.0)
VLDL: 12 mg/dL (ref <30)

## 2014-12-14 LAB — COMPREHENSIVE METABOLIC PANEL
ALBUMIN: 4.4 g/dL (ref 3.6–5.1)
ALT: 22 U/L (ref 9–46)
AST: 20 U/L (ref 10–40)
Alkaline Phosphatase: 42 U/L (ref 40–115)
BILIRUBIN TOTAL: 0.7 mg/dL (ref 0.2–1.2)
BUN: 16 mg/dL (ref 7–25)
CALCIUM: 9.7 mg/dL (ref 8.6–10.3)
CHLORIDE: 102 mmol/L (ref 98–110)
CO2: 28 mmol/L (ref 20–31)
CREATININE: 0.69 mg/dL (ref 0.60–1.35)
Glucose, Bld: 83 mg/dL (ref 65–99)
Potassium: 4.3 mmol/L (ref 3.5–5.3)
SODIUM: 141 mmol/L (ref 135–146)
TOTAL PROTEIN: 7.5 g/dL (ref 6.1–8.1)

## 2014-12-14 LAB — TSH: TSH: 1.38 u[IU]/mL (ref 0.350–4.500)

## 2014-12-14 NOTE — Progress Notes (Signed)
Subjective:    Patient ID: Mark Lawson, male    DOB: Sep 03, 1990, 24 y.o.   MRN: [id number]  HPI Patient presents for complete physical with complaint of tingling of area of upper right back after work.   PMH negative and not taking any medications. NKDA. Denies smoking, using illicit drugs, or drinking alcohol. Denies surgeries.   Is sexually active with monogamous male partner and they use condoms most of the time. Has never had STD screening.  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 Diet is balanced and gets plenty of fruits and vegetables. States that he drinks a gallon of water daily.   Tingling in back is intermittent and last only 1-2 minutes. Goes away by itself and is not associated with any pain, numbness, loss of sensation/ROM/fxn. Unsure of how long tingling has been present.   Health Maintenance: Immunizations UTD. Dental exams UTD. Has never had eye exam.   Review of Systems  Constitutional: Negative.  Negative for fever, chills, activity change, appetite change, fatigue and unexpected weight change.  HENT: Negative.  Negative for tinnitus.   Eyes: Negative.  Negative for photophobia and visual disturbance.  Respiratory: Negative.  Negative for cough, shortness of breath and wheezing.   Cardiovascular: Negative.  Negative for chest pain, palpitations and leg swelling.  Gastrointestinal: Negative.  Negative for nausea, vomiting, diarrhea, constipation and blood in stool.  Genitourinary: Negative.  Negative for dysuria, frequency, hematuria and decreased urine volume.  Musculoskeletal: Negative for myalgias, back pain, joint swelling, arthralgias, gait problem, neck pain and neck stiffness.  Skin: Negative.   Neurological: Negative.  Negative for dizziness, seizures, weakness, light-headedness, numbness and headaches.  Psychiatric/Behavioral: Negative.        Objective:   Physical Exam  Constitutional: He is oriented to  person, place, and time. He appears well-developed and well-nourished.  Blood pressure 110/74, pulse 65, temperature 98.3 F (36.8 C), temperature source Oral, resp. rate 18, height 5\' 6"  (1.676 m), weight 153 lb (69.4 kg), SpO2 98 %.  HENT:  Head: Normocephalic and atraumatic.  Right Ear: Hearing, tympanic membrane, external ear and ear canal normal.  Left Ear: Hearing, tympanic membrane, external ear and ear canal normal.  Nose: Nose normal.  Mouth/Throat: Uvula is midline, oropharynx is clear and moist and mucous membranes are normal.  Eyes: Conjunctivae, EOM and lids are normal. Pupils are equal, round, and reactive to light. Right eye exhibits no discharge. Left eye exhibits no discharge. No scleral icterus.  Neck: Trachea normal and normal range of motion. Neck supple. Carotid bruit is not present. No thyromegaly present.  Cardiovascular: Normal rate, regular rhythm, normal heart sounds, intact distal pulses and normal pulses.  Exam reveals no friction rub.   No murmur heard. Pulmonary/Chest: Effort normal and breath sounds normal. No respiratory distress. He has no wheezes. He has no rhonchi. He has no rales.  Abdominal: Soft. Normal appearance and bowel sounds are normal. He exhibits no distension, no abdominal bruit and no mass. There is no tenderness. There is no rebound and no guarding.  Musculoskeletal: Normal range of motion. He exhibits no edema or tenderness.       Right shoulder: Normal.       Left shoulder: Normal.       Cervical back: Normal.       Thoracic back: Normal.       Lumbar back: Normal.  Lymphadenopathy:       Head (right side): No submental, no submandibular,  no tonsillar, no preauricular, no posterior auricular and no occipital adenopathy present.       Head (left side): No submental, no submandibular, no tonsillar, no preauricular, no posterior auricular and no occipital adenopathy present.    He has no cervical adenopathy.  Neurological: He is alert and  oriented to person, place, and time. He has normal strength and normal reflexes. He displays no atrophy. No cranial nerve deficit or sensory deficit. He exhibits normal muscle tone. Coordination and gait normal.  Skin: Skin is warm, dry and intact. No lesion and no rash noted. No erythema.  Psychiatric: He has a normal mood and affect. His speech is normal and behavior is normal. Judgment and thought content normal.       Assessment & Plan:  1. Annual physical exam Age anticipatory guidance given.  2. Screening for diabetes mellitus - Comprehensive metabolic panel  3. Screening for cholesterol level - Lipid panel  4. Screening for thyroid disorder - TSH  5. Screen for STD (sexually transmitted disease) - GC/Chlamydia Probe Amp - HIV antibody - RPR  6. Screening for deficiency anemia - CBC  7. Back problem Likely related to lifting techniques. Discussed modifications and possibility of using back brace in order to have better posture. Should rtc if tingling worsens or is accompanied with pain.  Janan Ridge PA-C  [facility] 12/14/2014 3:48 PM

## 2014-12-14 NOTE — Patient Instructions (Signed)

## 2014-12-15 ENCOUNTER — Encounter: Payer: Self-pay | Admitting: Physician Assistant

## 2014-12-15 ENCOUNTER — Telehealth: Payer: Self-pay | Admitting: Physician Assistant

## 2014-12-15 LAB — GC/CHLAMYDIA PROBE AMP
CT Probe RNA: NEGATIVE
GC PROBE AMP APTIMA: NEGATIVE

## 2014-12-15 LAB — RPR

## 2014-12-15 LAB — HIV ANTIBODY (ROUTINE TESTING W REFLEX): HIV: NONREACTIVE

## 2014-12-15 NOTE — Telephone Encounter (Signed)
Left vmail. All labs normal. Will send letter in mail.

## 2015-01-25 IMAGING — CR DG CHEST 2V
2 series · 2 of 2 positions shown · non-contrast
Comparison: None.

CLINICAL DATA: Chest pain, nonsmoker.

EXAM:
CHEST  2 VIEW

[PA]
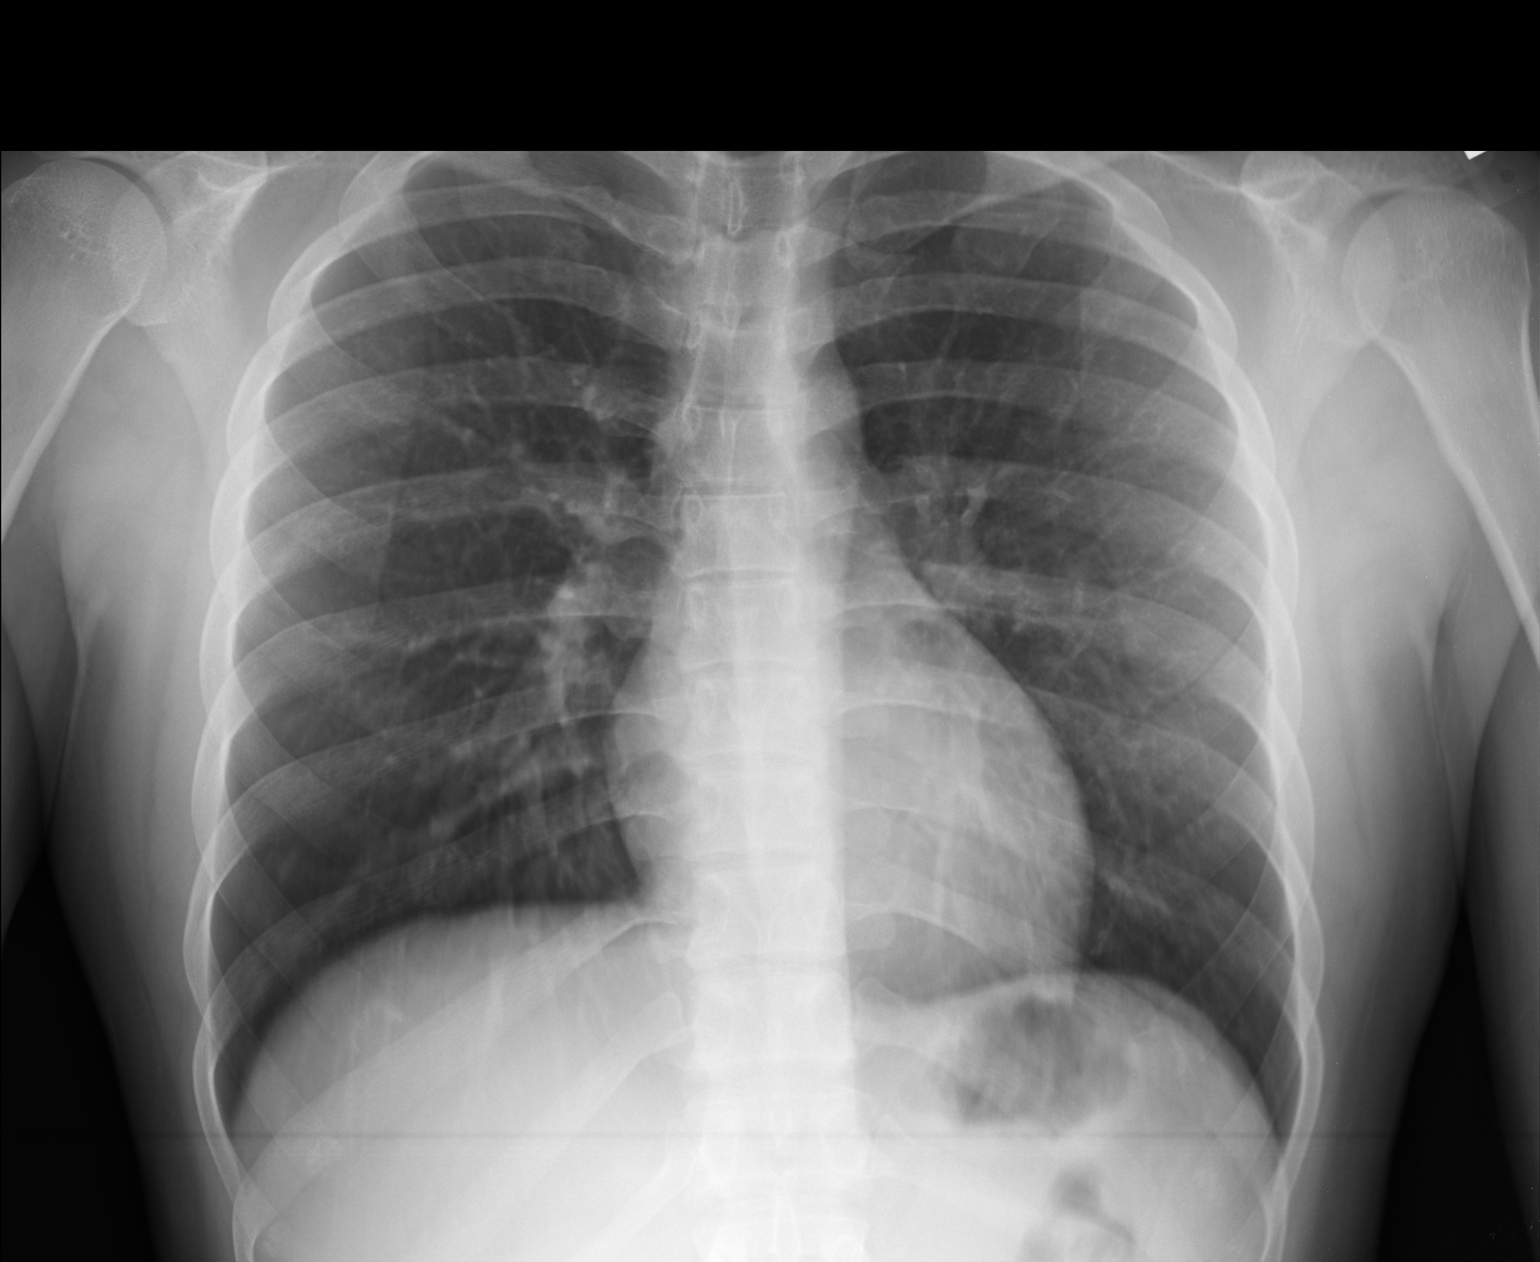

[lateral]
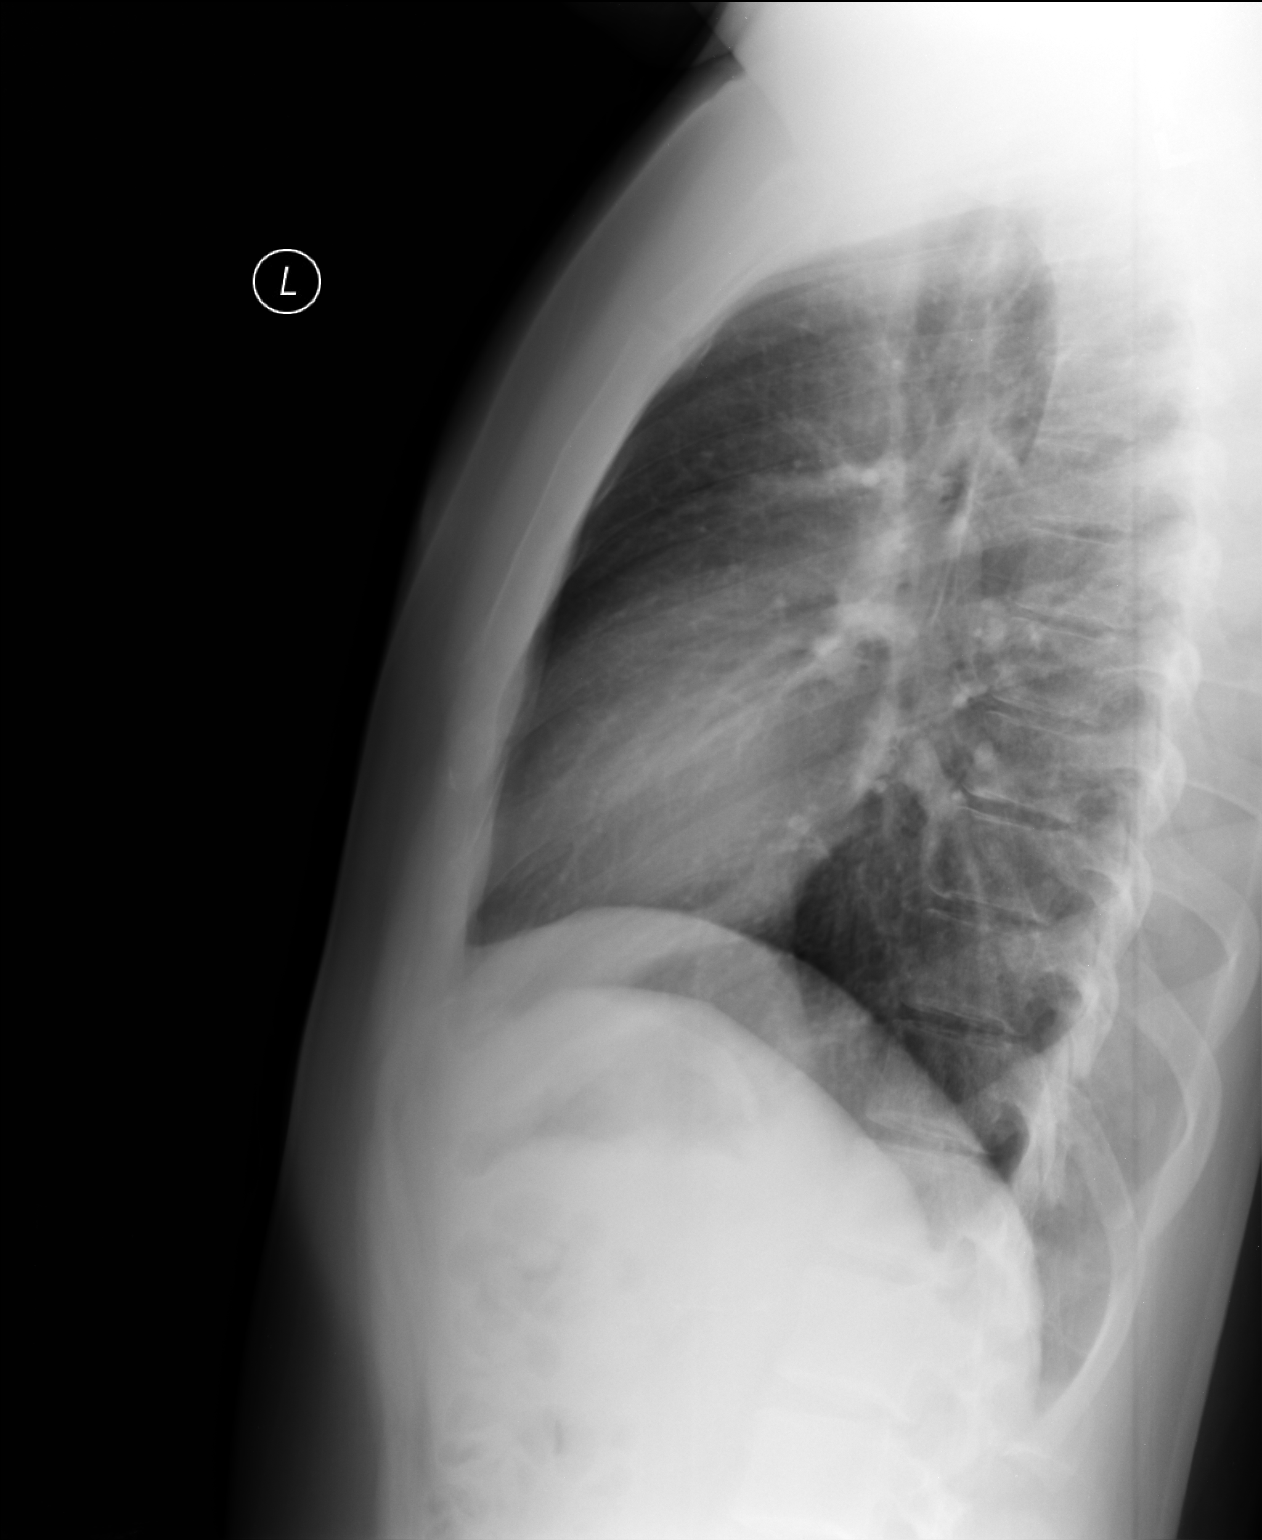

[2 of 2 positions shown; findings below may reference images not displayed]

FINDINGS: The heart size and mediastinal contours are within normal limits.
Both lungs are clear. The visualized skeletal structures are
unremarkable.
IMPRESSION: No active cardiopulmonary disease.

## 2015-04-17 ENCOUNTER — Ambulatory Visit (INDEPENDENT_AMBULATORY_CARE_PROVIDER_SITE_OTHER): Payer: Managed Care, Other (non HMO) | Admitting: Family Medicine

## 2015-04-17 ENCOUNTER — Ambulatory Visit (INDEPENDENT_AMBULATORY_CARE_PROVIDER_SITE_OTHER): Payer: Managed Care, Other (non HMO)

## 2015-04-17 VITALS — BP 102/70 | HR 62 | Temp 98.4°F | Resp 14 | Ht 66.0 in | Wt 156.2 lb

## 2015-04-17 DIAGNOSIS — R110 Nausea: Secondary | ICD-10-CM

## 2015-04-17 DIAGNOSIS — R1013 Epigastric pain: Secondary | ICD-10-CM | POA: Diagnosis not present

## 2015-04-17 DIAGNOSIS — K2970 Gastritis, unspecified, without bleeding: Secondary | ICD-10-CM

## 2015-04-17 LAB — POCT CBC
Granulocyte percent: 62.9 %G (ref 37–80)
HCT, POC: 45.6 % (ref 43.5–53.7)
Hemoglobin: 16.2 g/dL (ref 14.1–18.1)
Lymph, poc: 2.6 (ref 0.6–3.4)
MCH, POC: 32.6 pg — AB (ref 27–31.2)
MCHC: 35.4 g/dL (ref 31.8–35.4)
MCV: 92 fL (ref 80–97)
MID (cbc): 0.4 (ref 0–0.9)
MPV: 7.6 fL (ref 0–99.8)
POC Granulocyte: 5.1 (ref 2–6.9)
POC LYMPH PERCENT: 32.3 % (ref 10–50)
POC MID %: 4.8 % (ref 0–12)
Platelet Count, POC: 275 10*3/uL (ref 142–424)
RBC: 4.96 M/uL (ref 4.69–6.13)
RDW, POC: 12.4 %
WBC: 8.1 10*3/uL (ref 4.6–10.2)

## 2015-04-17 LAB — POCT URINALYSIS DIP (MANUAL ENTRY)
Bilirubin, UA: NEGATIVE
Blood, UA: NEGATIVE
Glucose, UA: NEGATIVE
Ketones, POC UA: NEGATIVE
Leukocytes, UA: NEGATIVE
Nitrite, UA: NEGATIVE
Protein Ur, POC: NEGATIVE
Spec Grav, UA: 1.025
Urobilinogen, UA: 0.2
pH, UA: 5.5

## 2015-04-17 LAB — POC MICROSCOPIC URINALYSIS (UMFC): Mucus: ABSENT

## 2015-04-17 MED ORDER — GI COCKTAIL ~~LOC~~: 30.0000 mL | Freq: Once | ORAL | Status: DC

## 2015-04-17 NOTE — Progress Notes (Signed)
Chief Complaint:  Chief Complaint  Patient presents with  . Abdominal Pain    worse when he eats/4 days    HPI: Mark Lawson is a 24 y.o. male who reports to [facility] today complaining of 3-4 day history of midepigastric pain afer he eats, takes about about 1 hours after he eats. He feesl nauseated, but no emesis.  He denies any asssociation with type of food, he tried eating something ighter and still had issue, he denies any bad tastes inhis mouth.  Does not radiate to back or side.  He has pain when he touches it int he middle. He has had no abdominal surgeries.  He denies any IBD, UC crohns,  He has had a BM, this AM and looked normal, normal color, size, nonbloody. He has not been passing gas.  He is a nonsmoker.  He deneis GERD.  He likes to eat all kinds , he has been eating cicken, steak , rice and  No improvement with vegetable or soup. LAst night was th eowrse since he could not sleep.  No diarrhea. No fevers or chill, no rashes. He has a headache  He has 7-8/10, he has not taken anything for this. He deneis any urianry sxs. He deneis kidney stones.    History reviewed. No pertinent past medical history. History reviewed. No pertinent past surgical history. Social History   Social History  . Marital Status: Single    Spouse Name: N/A  . Number of Children: N/A  . Years of Education: N/A   Social History Main Topics  . Smoking status: Never Smoker   . Smokeless tobacco: Never Used  . Alcohol Use: No  . Drug Use: No  . Sexual Activity: Yes    Birth Control/ Protection: Condom   Other Topics Concern  . None   Social History Narrative   Family History  Problem Relation Age of Onset  . Diabetes Maternal Grandmother    No Known Allergies Prior to Admission medications   Medication Sig Start Date End Date Taking? Authorizing Provider  cetirizine (ZYRTEC) 10 MG tablet Take 1 tablet (10 mg total) by mouth daily. Patient not taking: Reported on  12/14/2014 07/14/14   Andrena Mews, MD  fluticasone [facility]) 50 MCG/ACT nasal spray Place 2 sprays into both nostrils daily. Patient not taking: Reported on 12/14/2014 07/14/14   Andrena Mews, MD     ROS: The patient denies fevers, chills, night sweats, unintentional weight loss, chest pain, palpitations, wheezing, dyspnea on exertion, dysuria, hematuria, melena, numbness, weakness, or tingling.   All other systems have been reviewed and were otherwise negative with the exception of those mentioned in the HPI and as above.    PHYSICAL EXAM: Filed Vitals:   04/17/15 1617  BP: 102/70  Pulse: 62  Temp: 98.4 F (36.9 C)  Resp: 14   Body mass index is 25.22 kg/(m^2).   General: Alert, no acute distress HEENT:  Normocephalic, atraumatic, oropharynx patent. EOMI, PERRLA Cardiovascular:  Regular rate and rhythm, no rubs murmurs or gallops.  No Carotid bruits, radial pulse intact. No pedal edema.  Respiratory: Clear to auscultation bilaterally.  No wheezes, rales, or rhonchi.  No cyanosis, no use of accessory musculature Abdominal: No organomegaly, abdomen is soft and non-tender, positive bowel sounds. No masses. Skin: No rashes. Neurologic: Facial musculature symmetric. Psychiatric: Patient acts appropriately throughout our interaction. Lymphatic: No cervical or submandibular lymphadenopathy Musculoskeletal: Gait intact. No edema, tenderness   LABS: Results  for orders placed or performed in visit on 04/17/15  POCT CBC  Result Value Ref Range   WBC 8.1 4.6 - 10.2 K/uL   Lymph, poc 2.6 0.6 - 3.4   POC LYMPH PERCENT 32.3 10 - 50 %L   MID (cbc) 0.4 0 - 0.9   POC MID % 4.8 0 - 12 %M   POC Granulocyte 5.1 2 - 6.9   Granulocyte percent 62.9 37 - 80 %G   RBC 4.96 4.69 - 6.13 M/uL   Hemoglobin 16.2 14.1 - 18.1 g/dL   HCT, POC 16.145.6 09.643.5 - 53.7 %   MCV 92.0 80 - 97 fL   MCH, POC 32.6 (A) 27 - 31.2 pg   MCHC 35.4 31.8 - 35.4 g/dL   RDW, POC 04.512.4 %   Platelet Count, POC 275 142 -  424 K/uL   MPV 7.6 0 - 99.8 fL  POCT urinalysis dipstick  Result Value Ref Range   Color, UA yellow yellow   Clarity, UA clear clear   Glucose, UA negative negative   Bilirubin, UA negative negative   Ketones, POC UA negative negative   Spec Grav, UA 1.025    Blood, UA negative negative   pH, UA 5.5    Protein Ur, POC negative negative   Urobilinogen, UA 0.2    Nitrite, UA Negative Negative   Leukocytes, UA Negative Negative  POCT Microscopic Urinalysis ([facility])  Result Value Ref Range   WBC,UR,HPF,POC None None WBC/hpf   RBC,UR,HPF,POC None None RBC/hpf   Bacteria None None, Too numerous to count   Mucus Absent Absent   Epithelial Cells, UR Per Microscopy None None, Too numerous to count cells/hpf     EKG/XRAY:   Primary read [facility]. + stool, no stones, no pneumo, no masses , no free air   ASSESSMENT/PLAN: Encounter Diagnoses  Name Primary?  . Epigastric pain   . Nausea without vomiting   . Gastritis Yes   Use otc miralax GI cocktail, GERD avoidance given  Labs pending Fu prn    Gross sideeffects, risk and benefits, and alternatives of medications d/w patient. Patient is aware that all medications have potential sideeffects and we are unable to predict every sideeffect or drug-drug interaction that may occur.  Lynetta Tomczak DO  04/17/2015 7:04 PM

## 2015-04-17 NOTE — Patient Instructions (Signed)
Food Choices for Gastroesophageal Reflux Disease, Adult When you have gastroesophageal reflux disease (GERD), the foods you eat and your eating habits are very important. Choosing the right foods can help ease the discomfort of GERD. WHAT GENERAL GUIDELINES DO I NEED TO FOLLOW?  Choose fruits, vegetables, whole grains, low-fat dairy products, and low-fat meat, fish, and poultry.  Limit fats such as oils, salad dressings, butter, nuts, and avocado.  Keep a food diary to identify foods that cause symptoms.  Avoid foods that cause reflux. These may be different for different people.  Eat frequent small meals instead of three large meals each day.  Eat your meals slowly, in a relaxed setting.  Limit fried foods.  Cook foods using methods other than frying.  Avoid drinking alcohol.  Avoid drinking large amounts of liquids with your meals.  Avoid bending over or lying down until 2-3 hours after eating. WHAT FOODS ARE NOT RECOMMENDED? The following are some foods and drinks that may worsen your symptoms: Vegetables Tomatoes. Tomato juice. Tomato and spaghetti sauce. Chili peppers. Onion and garlic. Horseradish. Fruits Oranges, grapefruit, and lemon (fruit and juice). Meats High-fat meats, fish, and poultry. This includes hot dogs, ribs, ham, sausage, salami, and bacon. Dairy Whole milk and chocolate milk. Sour cream. Cream. Butter. Ice cream. Cream cheese.  Beverages Coffee and tea, with or without caffeine. Carbonated beverages or energy drinks. Condiments Hot sauce. Barbecue sauce.  Sweets/Desserts Chocolate and cocoa. Donuts. Peppermint and spearmint. Fats and Oils High-fat foods, including French fries and potato chips. Other Vinegar. Strong spices, such as black pepper, white pepper, red pepper, cayenne, curry powder, cloves, ginger, and chili powder. The items listed above may not be a complete list of foods and beverages to avoid. Contact your dietitian for more  information.   This information is not intended to replace advice given to you by your health care provider. Make sure you discuss any questions you have with your health care provider.   Document Released: 04/15/2005 Document Revised: 05/06/2014 Document Reviewed: 02/17/2013 Elsevier Interactive Patient Education 2016 [employer].  

## 2015-04-18 ENCOUNTER — Telehealth: Payer: Self-pay | Admitting: Family Medicine

## 2015-04-18 LAB — COMPLETE METABOLIC PANEL WITHOUT GFR
AST: 17 U/L (ref 10–40)
Albumin: 4.5 g/dL (ref 3.6–5.1)
Alkaline Phosphatase: 42 U/L (ref 40–115)
BUN: 11 mg/dL (ref 7–25)
Calcium: 9.1 mg/dL (ref 8.6–10.3)
Creat: 0.72 mg/dL (ref 0.60–1.35)
Glucose, Bld: 93 mg/dL (ref 65–99)
Sodium: 137 mmol/L (ref 135–146)
Total Bilirubin: 0.5 mg/dL (ref 0.2–1.2)
Total Protein: 7 g/dL (ref 6.1–8.1)

## 2015-04-18 LAB — COMPLETE METABOLIC PANEL WITH GFR
ALT: 21 U/L (ref 9–46)
CO2: 26 mmol/L (ref 20–31)
Chloride: 100 mmol/L (ref 98–110)
GFR, Est African American: >89 mL/min (ref >=60)
GFR, Est Non African American: >89 mL/min (ref >=60)
Potassium: 3.9 mmol/L (ref 3.5–5.3)

## 2015-04-18 LAB — LIPASE: Lipase: 23 U/L (ref 7–60)

## 2015-04-18 LAB — H. PYLORI BREATH TEST: H. pylori Breath Test: NOT DETECTED

## 2015-04-18 NOTE — Telephone Encounter (Signed)
Lm about normal labs.

## 2016-08-06 IMAGING — CR DG ABDOMEN ACUTE W/ 1V CHEST
3 series · 3 of 3 positions shown · non-contrast
Comparison: Chest radiograph October 05, 2013

CLINICAL DATA: Abdominal pain for 4 days

EXAM:
DG ABDOMEN ACUTE W/ 1V CHEST

[PA]
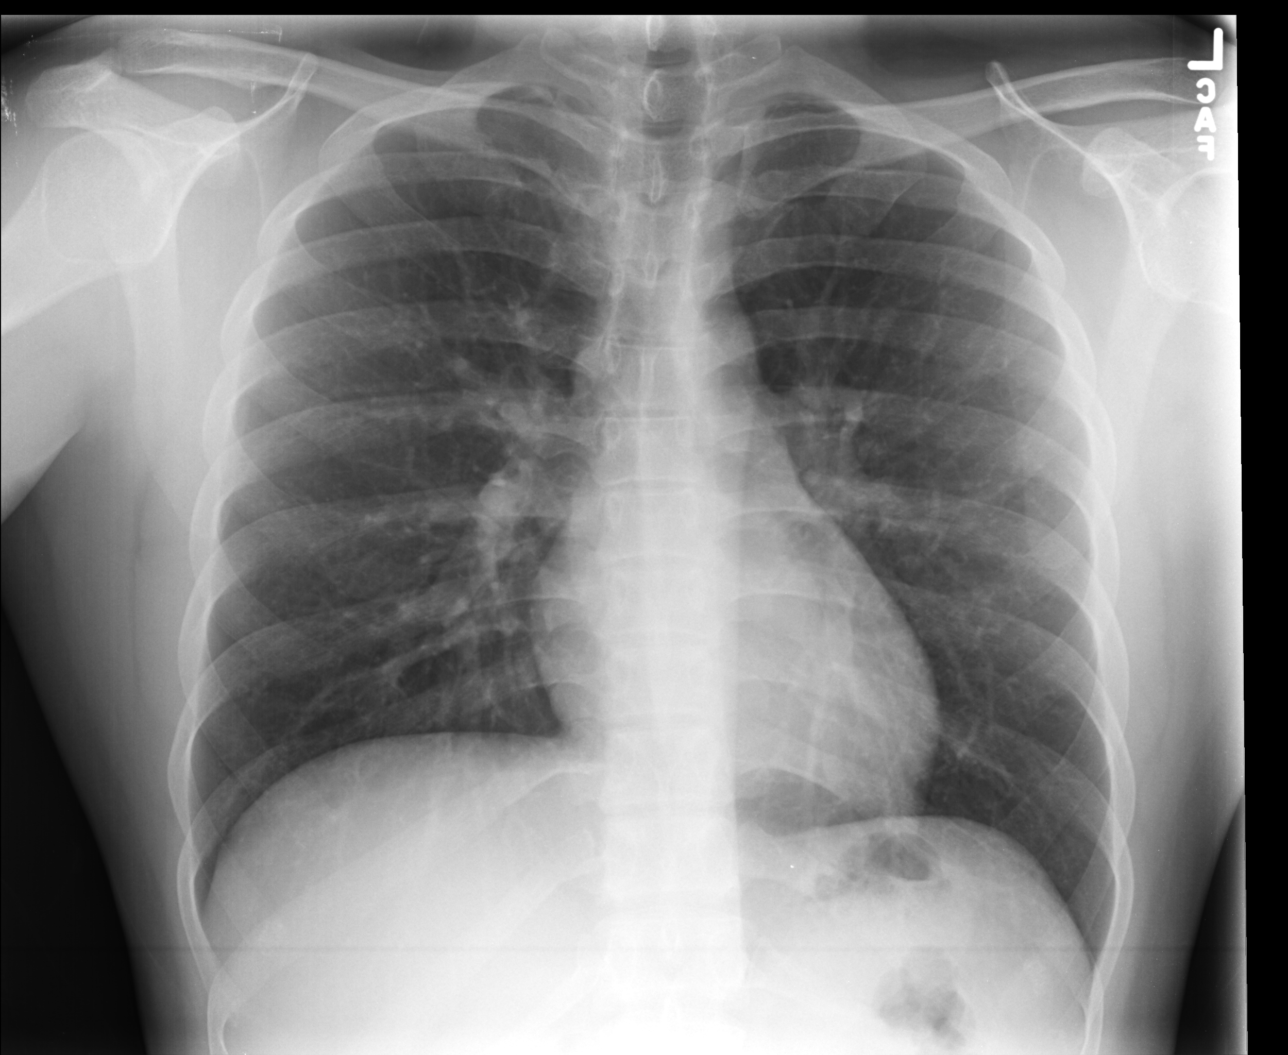

[AP (1 of 2)]
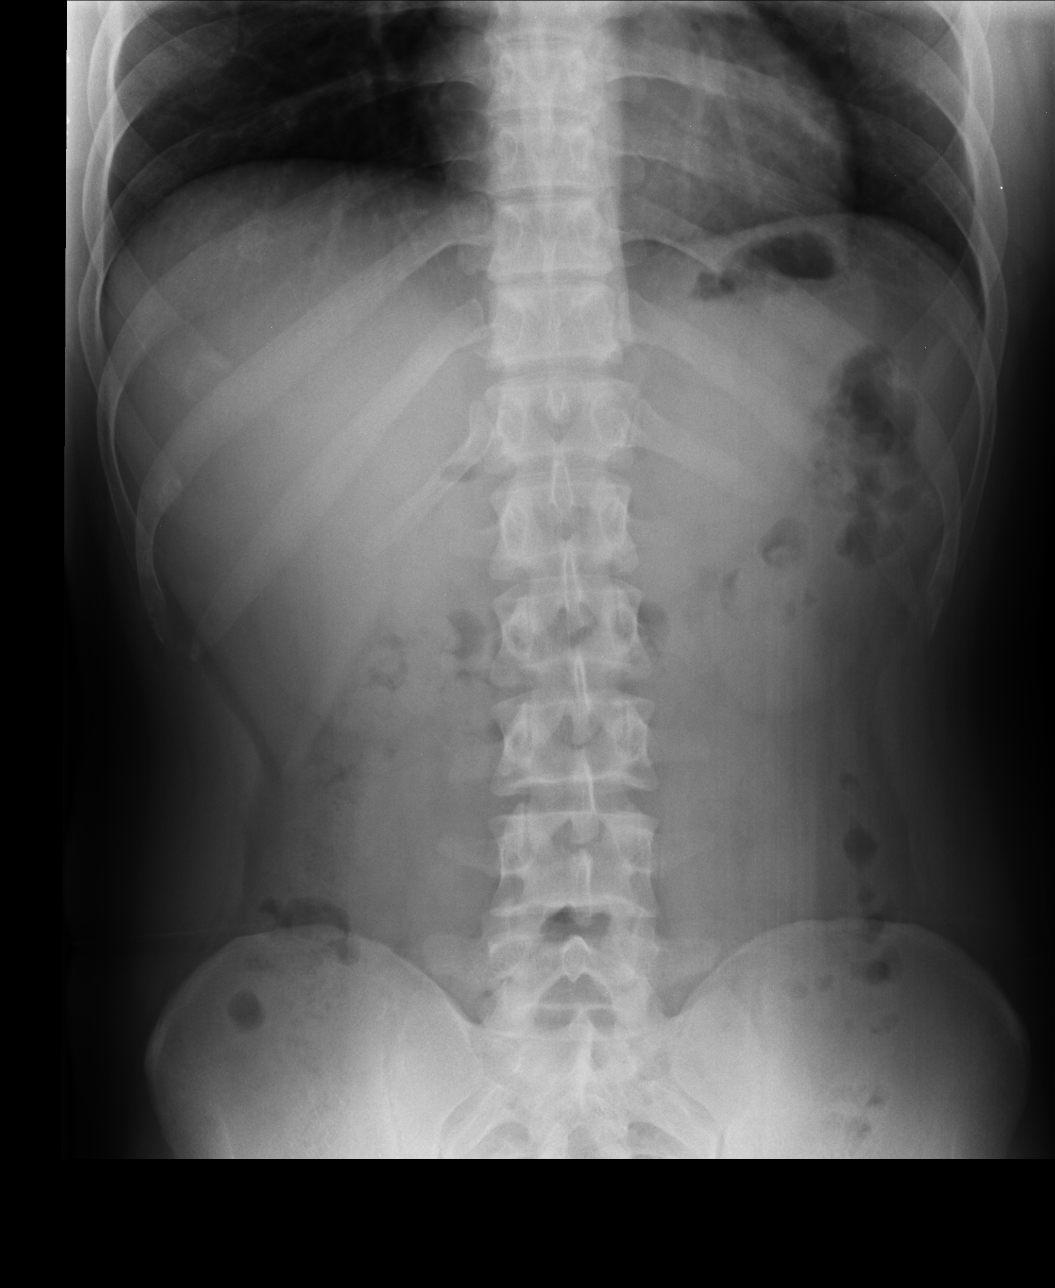

[AP (2 of 2)]
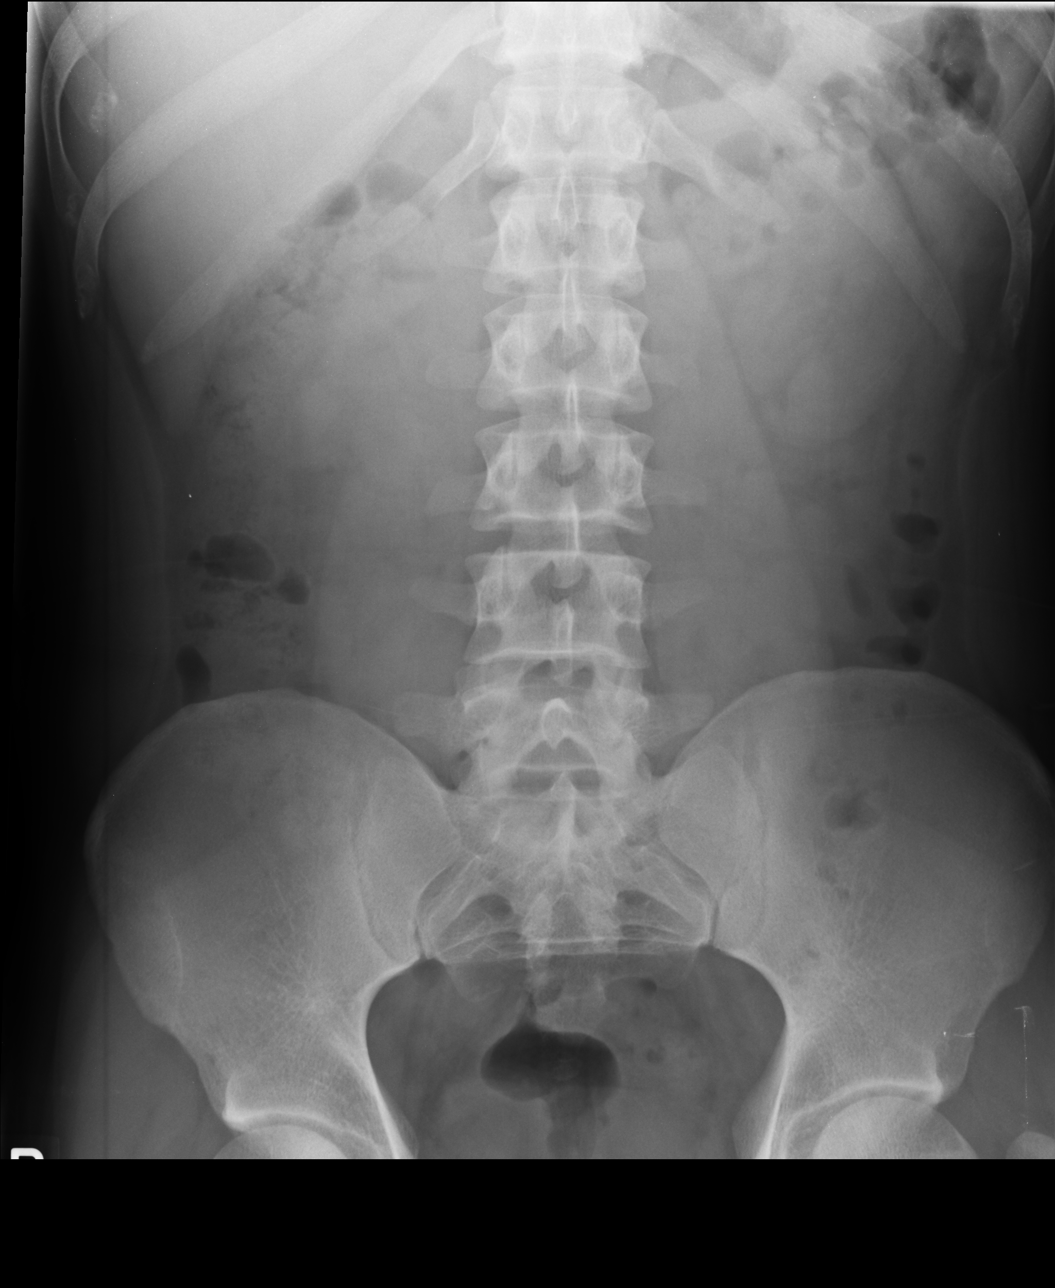

[3 of 3 positions shown; findings below may reference images not displayed]

FINDINGS: PA chest: No edema or consolidation. The heart size and pulmonary
vascularity are normal. No adenopathy.

Supine and upright abdomen: There is fairly diffuse stool throughout
the colon. Colon is not distended with stool. No bowel dilatation or
air-fluid level suggesting obstruction. No free air. No abnormal
calcifications.
IMPRESSION: Fairly diffuse stool throughout colon. No obstruction or free air.
Lungs clear.

## 2017-09-25 ENCOUNTER — Ambulatory Visit (INDEPENDENT_AMBULATORY_CARE_PROVIDER_SITE_OTHER): Payer: Managed Care, Other (non HMO) | Admitting: Family Medicine

## 2017-09-25 ENCOUNTER — Encounter: Payer: Self-pay | Admitting: Family Medicine

## 2017-09-25 ENCOUNTER — Other Ambulatory Visit: Payer: Self-pay

## 2017-09-25 VITALS — BP 122/80 | HR 73 | Temp 99.5°F | Ht 67.72 in | Wt 149.0 lb

## 2017-09-25 DIAGNOSIS — Z113 Encounter for screening for infections with a predominantly sexual mode of transmission: Secondary | ICD-10-CM | POA: Diagnosis not present

## 2017-09-25 DIAGNOSIS — Z1329 Encounter for screening for other suspected endocrine disorder: Secondary | ICD-10-CM

## 2017-09-25 DIAGNOSIS — Z13220 Encounter for screening for lipoid disorders: Secondary | ICD-10-CM | POA: Diagnosis not present

## 2017-09-25 DIAGNOSIS — Z130 Encounter for screening for diseases of the blood and blood-forming organs and certain disorders involving the immune mechanism: Secondary | ICD-10-CM

## 2017-09-25 DIAGNOSIS — Z0000 Encounter for general adult medical examination without abnormal findings: Secondary | ICD-10-CM

## 2017-09-25 DIAGNOSIS — Z13228 Encounter for screening for other metabolic disorders: Secondary | ICD-10-CM

## 2017-09-25 NOTE — Progress Notes (Signed)
5/30/20195:53 PM  Mark Lawson [birth year], 27 y.o. male [id number]  Chief Complaint  Patient presents with  . Annual Exam    HPI:   Patient is a 27 y.o. male  who presents today for CPE  Healthy, no concerns today Last cpe was 4 years ago Married, happy, has a one year old Employed Requesting STD testing   Fall Risk  09/25/2017  Falls in the past year? No     Depression screen [facility] 2/9 09/25/2017 04/17/2015 12/14/2014  Decreased Interest 0 0 0  Down, Depressed, Hopeless 0 0 0  PHQ - 2 Score 0 0 0    No Known Allergies  Prior to Admission medications   Medication Sig Start Date End Date Taking? Authorizing Provider    History reviewed. No pertinent past medical history.  History reviewed. No pertinent surgical history.  Social History   Tobacco Use  . Smoking status: Never Smoker  . Smokeless tobacco: Never Used  Substance Use Topics  . Alcohol use: No    Alcohol/week: 0.0 oz    Family History  Problem Relation Age of Onset  . Healthy Mother   . Healthy Father   . Diabetes Maternal Grandmother   . Healthy Sister   . Healthy Sister     Review of Systems  Constitutional: Negative for chills and fever.  Eyes: Negative for blurred vision and double vision.  Respiratory: Negative for cough and shortness of breath.   Cardiovascular: Negative for chest pain, palpitations and leg swelling.  Gastrointestinal: Negative for abdominal pain, nausea and vomiting.  All other systems reviewed and are negative.    OBJECTIVE:  Blood pressure 122/80, pulse 73, temperature 99.5 F (37.5 C), temperature source Oral, height 5' 7.72" (1.72 m), weight 149 lb (67.6 kg), SpO2 98 %.   Physical Exam  Constitutional: He is oriented to person, place, and time. He appears well-developed and well-nourished.  HENT:  Head: Normocephalic and atraumatic.  Right Ear: Hearing, tympanic membrane, external ear and ear canal normal.  Left Ear: Hearing, tympanic  membrane, external ear and ear canal normal.  Mouth/Throat: Oropharynx is clear and moist. No oropharyngeal exudate.  Eyes: Pupils are equal, round, and reactive to light. Conjunctivae and EOM are normal.  Neck: Neck supple. No thyromegaly present.  Cardiovascular: Normal rate, regular rhythm, normal heart sounds and intact distal pulses. Exam reveals no gallop and no friction rub.  No murmur heard. Pulmonary/Chest: Effort normal and breath sounds normal. He has no wheezes. He has no rhonchi. He has no rales.  Abdominal: Soft. Bowel sounds are normal. He exhibits no distension and no mass. There is no tenderness.  Musculoskeletal: Normal range of motion. He exhibits no edema.  Lymphadenopathy:    He has no cervical adenopathy.  Neurological: He is alert and oriented to person, place, and time. He has normal strength and normal reflexes. No cranial nerve deficit. Coordination and gait normal.  Skin: Skin is warm and dry.  Psychiatric: He has a normal mood and affect.  Nursing note and vitals reviewed.     ASSESSMENT and PLAN  1. Annual physical exam No concerns per history or exam. Routine HCM labs ordered. HCM reviewed/discussed. Anticipatory guidance regarding healthy weight, lifestyle and choices given.   2. Screening for lipid disorders - Lipid panel  3. Screening for deficiency anemia - CBC with Differential/Platelet  4. Screening for metabolic disorder - Hemoglobin A1c - CMP14+EGFR  5. Screening for thyroid disorder  6. Screen for STD (sexually transmitted  disease) - HIV antibody - HCV Ab w Reflex to Quant PCR - RPR - GC/Chlamydia Probe Amp([facility])  Return in about 1 year (around 09/26/2018).    Rutherford Guys, MD [facility] at [place], [place] [address] Ph.  [phone] Fax [phone]

## 2017-09-25 NOTE — Patient Instructions (Addendum)
   IF you received an x-ray today, you will receive an invoice from [facility]. Please contact [facility] at [phone] with questions or concerns regarding your invoice.   IF you received labwork today, you will receive an invoice from [facility]. Please contact [facility] at [phone] with questions or concerns regarding your invoice.   Our billing staff will not be able to assist you with questions regarding bills from these companies.  You will be contacted with the lab results as soon as they are available. The fastest way to get your results is to activate your My Chart account. Instructions are located on the last page of this paperwork. If you have not heard from us regarding the results in 2 weeks, please contact this office.     Preventive Care 18-39 Years, Male Preventive care refers to lifestyle choices and visits with your health care provider that can promote health and wellness. What does preventive care include?  A yearly physical exam. This is also called an annual well check.  Dental exams once or twice a year.  Routine eye exams. Ask your health care provider how often you should have your eyes checked.  Personal lifestyle choices, including: ? Daily care of your teeth and gums. ? Regular physical activity. ? Eating a healthy diet. ? Avoiding tobacco and drug use. ? Limiting alcohol use. ? Practicing safe sex. What happens during an annual well check? The services and screenings done by your health care provider during your annual well check will depend on your age, overall health, lifestyle risk factors, and family history of disease. Counseling Your health care provider may ask you questions about your:  Alcohol use.  Tobacco use.  Drug use.  Emotional well-being.  Home and relationship well-being.  Sexual activity.  Eating habits.  Work and work environment.  Screening You may have the following tests or  measurements:  Height, weight, and BMI.  Blood pressure.  Lipid and cholesterol levels. These may be checked every 5 years starting at age 20.  Diabetes screening. This is done by checking your blood sugar (glucose) after you have not eaten for a while (fasting).  Skin check.  Hepatitis C blood test.  Hepatitis B blood test.  Sexually transmitted disease (STD) testing.  Discuss your test results, treatment options, and if necessary, the need for more tests with your health care provider. Vaccines Your health care provider may recommend certain vaccines, such as:  Influenza vaccine. This is recommended every year.  Tetanus, diphtheria, and acellular pertussis (Tdap, Td) vaccine. You may need a Td booster every 10 years.  Varicella vaccine. You may need this if you have not been vaccinated.  HPV vaccine. If you are 26 or younger, you may need three doses over 6 months.  Measles, mumps, and rubella (MMR) vaccine. You may need at least one dose of MMR.You may also need a second dose.  Pneumococcal 13-valent conjugate (PCV13) vaccine. You may need this if you have certain conditions and have not been vaccinated.  Pneumococcal polysaccharide (PPSV23) vaccine. You may need one or two doses if you smoke cigarettes or if you have certain conditions.  Meningococcal vaccine. One dose is recommended if you are age 19-21 years and a first-year college student living in a residence hall, or if you have one of several medical conditions. You may also need additional booster doses.  Hepatitis A vaccine. You may need this if you have certain conditions or if you travel or work in places   where you may be exposed to hepatitis A.  Hepatitis B vaccine. You may need this if you have certain conditions or if you travel or work in places where you may be exposed to hepatitis B.  Haemophilus influenzae type b (Hib) vaccine. You may need this if you have certain risk factors.  Talk to your health  care provider about which screenings and vaccines you need and how often you need them. This information is not intended to replace advice given to you by your health care provider. Make sure you discuss any questions you have with your health care provider. Document Released: 06/11/2001 Document Revised: 01/03/2016 Document Reviewed: 02/14/2015 Elsevier Interactive Patient Education  2018 Elsevier Inc.  

## 2017-09-26 LAB — CBC WITH DIFFERENTIAL/PLATELET
Basophils Absolute: 0 10*3/uL (ref 0.0–0.2)
Basos: 0 %
EOS (ABSOLUTE): 0.2 10*3/uL (ref 0.0–0.4)
Eos: 4 %
Hematocrit: 47.2 % (ref 37.5–51.0)
Hemoglobin: 16 g/dL (ref 13.0–17.7)
Immature Grans (Abs): 0 10*3/uL (ref 0.0–0.1)
Immature Granulocytes: 0 %
Lymphocytes Absolute: 2.4 10*3/uL (ref 0.7–3.1)
Lymphs: 40 %
MCH: 31.6 pg (ref 26.6–33.0)
MCHC: 33.9 g/dL (ref 31.5–35.7)
MCV: 93 fL (ref 79–97)
Monocytes Absolute: 0.4 10*3/uL (ref 0.1–0.9)
Monocytes: 7 %
Neutrophils Absolute: 3 10*3/uL (ref 1.4–7.0)
Neutrophils: 49 %
Platelets: 366 10*3/uL (ref 150–450)
RBC: 5.06 x10E6/uL (ref 4.14–5.80)
RDW: 12.7 % (ref 12.3–15.4)
WBC: 6 10*3/uL (ref 3.4–10.8)

## 2017-09-26 LAB — CMP14+EGFR
ALT: 20 IU/L (ref 0–44)
AST: 17 IU/L (ref 0–40)
Albumin/Globulin Ratio: 1.7 (ref 1.2–2.2)
Albumin: 4.7 g/dL (ref 3.5–5.5)
Alkaline Phosphatase: 55 IU/L (ref 39–117)
BUN/Creatinine Ratio: 24 — ABNORMAL HIGH (ref 9–20)
BUN: 18 mg/dL (ref 6–20)
Bilirubin Total: 0.4 mg/dL (ref 0.0–1.2)
CO2: 26 mmol/L (ref 20–29)
Calcium: 9.7 mg/dL (ref 8.7–10.2)
Chloride: 103 mmol/L (ref 96–106)
Creatinine, Ser: 0.75 mg/dL — ABNORMAL LOW (ref 0.76–1.27)
GFR calc Af Amer: 147 mL/min/{1.73_m2} (ref >59)
GFR calc non Af Amer: 127 mL/min/{1.73_m2} (ref >59)
Globulin, Total: 2.8 g/dL (ref 1.5–4.5)
Glucose: 88 mg/dL (ref 65–99)
Potassium: 4.6 mmol/L (ref 3.5–5.2)
Sodium: 142 mmol/L (ref 134–144)
Total Protein: 7.5 g/dL (ref 6.0–8.5)

## 2017-09-26 LAB — LIPID PANEL
Chol/HDL Ratio: 2.4 ratio (ref 0.0–5.0)
Cholesterol, Total: 189 mg/dL (ref 100–199)
HDL: 78 mg/dL (ref >39)
LDL Calculated: 98 mg/dL (ref 0–99)
Triglycerides: 66 mg/dL (ref 0–149)
VLDL Cholesterol Cal: 13 mg/dL (ref 5–40)

## 2017-09-26 LAB — HEMOGLOBIN A1C
Est. average glucose Bld gHb Est-mCnc: 108 mg/dL
Hgb A1c MFr Bld: 5.4 % (ref 4.8–5.6)

## 2017-09-26 LAB — HIV ANTIBODY (ROUTINE TESTING W REFLEX): HIV Screen 4th Generation wRfx: NONREACTIVE

## 2017-09-26 LAB — RPR: RPR Ser Ql: NONREACTIVE

## 2017-09-26 LAB — HCV INTERPRETATION

## 2017-09-26 LAB — HCV AB W REFLEX TO QUANT PCR: HCV Ab: <0.1 s/co ratio (ref 0.0–0.9)

## 2017-09-27 LAB — GC/CHLAMYDIA PROBE AMP
Chlamydia trachomatis, NAA: NEGATIVE
Neisseria gonorrhoeae by PCR: NEGATIVE

## 2017-10-08 ENCOUNTER — Encounter: Payer: Self-pay | Admitting: *Deleted

## 2018-09-10 ENCOUNTER — Other Ambulatory Visit (HOSPITAL_COMMUNITY): Admission: RE | Admit: 2018-09-10 | Payer: Self-pay | Source: Ambulatory Visit

## 2018-09-10 ENCOUNTER — Telehealth: Payer: Self-pay | Admitting: *Deleted

## 2018-09-10 NOTE — Telephone Encounter (Addendum)
Patient scheduled for COVID-19 testing.

## 2018-09-11 ENCOUNTER — Telehealth: Payer: Self-pay | Admitting: *Deleted

## 2018-09-11 ENCOUNTER — Other Ambulatory Visit (HOSPITAL_COMMUNITY)
Admission: RE | Admit: 2018-09-11 | Discharge: 2018-09-11 | Disposition: A | Discharge status: Home / Self Care | Payer: Self-pay | Source: Ambulatory Visit | Attending: Internal Medicine | Admitting: Internal Medicine

## 2018-09-11 ENCOUNTER — Other Ambulatory Visit (HOSPITAL_COMMUNITY): Payer: Self-pay

## 2018-09-11 ENCOUNTER — Other Ambulatory Visit: Payer: Self-pay

## 2018-09-11 DIAGNOSIS — Z20828 Contact with and (suspected) exposure to other viral communicable diseases: Secondary | ICD-10-CM | POA: Insufficient documentation

## 2018-09-11 LAB — SARS CORONAVIRUS 2 BY RT PCR (HOSPITAL ORDER, PERFORMED IN ~~LOC~~ HOSPITAL LAB): SARS Coronavirus 2: NEGATIVE

## 2018-09-11 NOTE — Telephone Encounter (Signed)
Pt returned call asking if he could be provided with proof of negative test results to take to work. Pt states he does not have MyChart. Advised pt that a return call would be given at [phone] once that info is obtained.

## 2018-09-11 NOTE — Telephone Encounter (Signed)
Returned call to pt and notified pt that a letter could be given stating that his results were negative. Pt states he would like to have the letter emailed to [email or web address], which has also updated in chart. Notified pt that provider would be able to send the letter via email on tomorrow morning. Understanding verbalized.

## 2018-09-11 NOTE — Telephone Encounter (Signed)
Pt notified of negative test results for COVID-19. Understanding verbalized.

## 2018-12-30 ENCOUNTER — Ambulatory Visit: Payer: Managed Care, Other (non HMO) | Admitting: Emergency Medicine

## 2018-12-30 ENCOUNTER — Encounter: Payer: Self-pay | Admitting: Emergency Medicine

## 2018-12-30 ENCOUNTER — Other Ambulatory Visit: Payer: Self-pay

## 2018-12-30 VITALS — BP 99/64 | HR 55 | Temp 98.7°F | Resp 16 | Ht 67.0 in | Wt 150.0 lb

## 2018-12-30 DIAGNOSIS — Z4802 Encounter for removal of sutures: Secondary | ICD-10-CM

## 2018-12-30 DIAGNOSIS — S61022D Laceration with foreign body of left thumb without damage to nail, subsequent encounter: Secondary | ICD-10-CM | POA: Diagnosis not present

## 2018-12-30 NOTE — Progress Notes (Signed)
Mark Lawson 28 y.o.   Chief Complaint  Patient presents with  . Suture / Staple Removal    LEFT thumb per patient was put in [facility]    HISTORY OF PRESENT ILLNESS: This is a 28 y.o. male status post left thumb laceration about 2 weeks ago.  Here for suture removal.  No complaints or medical concerns today.  HPI   Prior to Admission medications   Not on File    No Known Allergies  Patient Active Problem List   Diagnosis Date Noted  . Onychomycosis 03/02/2014  . Hypertriglyceridemia 10/05/2013    No past medical history on file.  No past surgical history on file.  Social History   Socioeconomic History  . Marital status: Single    Spouse name: Not on file  . Number of children: Not on file  . Years of education: Not on file  . Highest education level: Not on file  Occupational History  . Not on file  Social Needs  . Financial resource strain: Not on file  . Food insecurity    Worry: Not on file    Inability: Not on file  . Transportation needs    Medical: Not on file    Non-medical: Not on file  Tobacco Use  . Smoking status: Never Smoker  . Smokeless tobacco: Never Used  Substance and Sexual Activity  . Alcohol use: No    Alcohol/week: 0.0 standard drinks  . Drug use: No  . Sexual activity: Yes    Birth control/protection: Condom  Lifestyle  . Physical activity    Days per week: Not on file    Minutes per session: Not on file  . Stress: Not on file  Relationships  . Social Herbalist on phone: Not on file    Gets together: Not on file    Attends religious service: Not on file    Active member of club or organization: Not on file    Attends meetings of clubs or organizations: Not on file    Relationship status: Not on file  . Intimate partner violence    Fear of current or ex partner: Not on file    Emotionally abused: Not on file    Physically abused: Not on file    Forced sexual activity: Not on file   Other Topics Concern  . Not on file  Social History Narrative  . Not on file    Family History  Problem Relation Age of Onset  . Healthy Mother   . Healthy Father   . Diabetes Maternal Grandmother   . Healthy Sister   . Healthy Sister      Review of Systems  Constitutional: Negative.  Negative for chills and fever.  HENT: Negative for congestion and sore throat.   Respiratory: Negative for cough and shortness of breath.   Gastrointestinal: Negative for abdominal pain, diarrhea, nausea and vomiting.  Musculoskeletal: Negative for myalgias.  Neurological: Negative for dizziness and headaches.  All other systems reviewed and are negative.   Today's Vitals   12/30/18 1119  BP: 99/64  Pulse: (!) 55  Resp: 16  Temp: 98.7 F (37.1 C)  TempSrc: Oral  SpO2: 98%  Weight: 150 lb (68 kg)  Height: 5\' 7"  (1.702 m)   Body mass index is 23.49 kg/m.  Physical Exam Vitals signs reviewed.  Constitutional:      Appearance: Normal appearance.  HENT:     Head: Normocephalic.  Eyes:     Extraocular Movements: Extraocular movements intact.     Pupils: Pupils are equal, round, and reactive to light.  Neck:     Musculoskeletal: Normal range of motion.  Cardiovascular:     Rate and Rhythm: Normal rate.  Pulmonary:     Effort: Pulmonary effort is normal.  Skin:    General: Skin is warm and dry.     Comments: Left thumb: Well-healed vertical laceration on dorsal aspect.  No infection.  Mild soft tissue swelling probably reaction to sutures.  No discharge.  Full range of motion and neurovascularly intact.  Neurological:     General: No focal deficit present.     Mental Status: He is alert and oriented to person, place, and time.  Psychiatric:        Mood and Affect: Mood normal.        Behavior: Behavior normal.    Procedure note: 4 sutures removed without complications or bleeding.  Tolerated procedure well.  ASSESSMENT & PLAN:  Mark Lawson was seen today for suture / staple  removal.  Diagnoses and all orders for this visit:  Laceration of left thumb with foreign body without damage to nail, subsequent encounter Comments: Well-healed  Encounter for removal of sutures    Patient Instructions       If you have lab work done today you will be contacted with your lab results within the next 2 weeks.  If you have not heard from us then please contact us. The fastest way to get your results is to register for My Chart.   IF you received an x-ray today, you will [facility] an [facility]. Please contact [facility] at [phone] with questions or concerns regarding your invoice.   IF you received labwork today, you will receive an invoice from [facility]. Please contact [facility] at [phone] with questions or concerns regarding your invoice.   Our billing staff will not be able to assist you with questions regarding bills from these companies.  You will be contacted with the lab results as soon as they are available. The fastest way to get your results is to activate your My Chart account. Instructions are located on the last page of this paperwork. If you have not heard from us regarding the results in 2 weeks, please contact this office.     Remocin de la sutura, cuidados posteriores Suture Removal, Care After Lea esta informacin sobre cmo cuidarse despus del procedimiento. Su mdico tambin podr darle indicaciones ms especficas. Comunquese con su mdico si tiene problemas o preguntas. Qu puedo esperar despus del procedimiento? Despus de que le retiren los puntos (suturas), es comn tener lo siguiente:  Molestias e hinchazn en la zona.  Leve enrojecimiento en la zona de la herida. Siga estas indicaciones en su casa: Si tiene una venda:  Lvese las manos con agua y jabn antes de Multimedia programmercambiar las vendas (vendajes). Use desinfectante para manos si no dispone de [place] y [place].  Cambie los vendajes como se lo haya  indicado el mdico. Si el vendaje se moja, se ensucia o tiene mal olor, cmbielo tan pronto como pueda.  Si el vendaje se le pega en la piel, remjelo en agua tibia para aflojarlo. Cuidados de la Engineer, technical salesherida   Controle la herida todos los [address]  [id number] para detectar signos de infeccin. Est atento a los siguientes signos: ? Aumento del enrojecimiento, de la hinchazn o del dolor. ? Lquido o sangre. ? Calor. ? Pus o mal olor.  Lvese las manos con agua y [place], antes y despus de tocarse la herida.  Solo aplique un ungento o crema segn las indicaciones del mdico. Si [place] crema o ungento, lave la zona con agua y [address] veces por da para quitarlo todo. Enjuague el jabn y seque suavemente la zona con una toalla limpia.  Si tiene goma para cerrar la piel o tiras [employer] sobre la herida, Psychologist, sport and exercise. Tal vez deban dejarse puestos en la piel durante 2semanas o ms tiempo. Si los bordes de las tiras [address] empiezan a despegarse y Scientific laboratory technician, puede recortar los que estn sueltos. No retire las tiras [employer] por completo a menos que el mdico se lo indique.  Mantenga la herida limpia y seca. No tome baos de inmersin, no nade ni use el jacuzzi hasta que el mdico lo autorice.  Contine protegiendo la herida de lesiones.  No se toque la herida. Esto puede causar una infeccin.  Una vez que la herida haya cicatrizado por completo, cbrala con pantalla solar o con una prenda cuando est al [place]. Las cicatrices se queman fcilmente con el sol, lo que puede Herbalist. Instrucciones generales  [employer] de venta libre y los recetados solamente como se lo haya indicado el mdico.  Oceanographer a todas las visitas de control como se lo haya indicado el mdico. Esto es importante. Comunquese con un mdico si:  Tiene enrojecimiento, hinchazn o dolor alrededor de la herida.  Observa lquido o sangre que salen de la herida.  La herida est caliente al tacto.   Tiene pus o percibe mal olor que emana de la herida.  La herida se abre. Solicite ayuda de inmediato si:  Tiene fiebre.  El enrojecimiento se expande desde la herida. Resumen  Despus de que le retiren los puntos, es comn tener molestias e hinchazn en la zona.  Lvese las manos con agua y jabn antes de Multimedia programmer las vendas (vendajes).  Mantenga la herida limpia y seca. No tome baos de inmersin, no nade ni use el jacuzzi hasta que el mdico lo autorice. Esta informacin no tiene Theme park manager el consejo del mdico. Asegrese de hacerle al mdico cualquier pregunta que tenga. Document Released: 01/23/2005 Document Revised: 12/24/2016 Document Reviewed: 12/24/2016 Elsevier Patient Education  2020 Elsevier Inc.     Edwina Barth, MD [facility]

## 2018-12-30 NOTE — Patient Instructions (Addendum)
If you have lab work done today you will be contacted with your lab results within the next 2 weeks.  If you have not heard from [place] then please contact us. The fastest way to get your results is to register for My Chart.   IF you received an x-ray today, you will receive an invoice from [facility]. Please contact [facility] at [phone] with questions or concerns regarding your invoice.   IF you received labwork today, you will receive an invoice from [facility]. Please contact [facility] at [phone] with questions or concerns regarding your invoice.   Our billing staff will not be able to assist you with questions regarding bills from these companies.  You will be contacted with the lab results as soon as they are available. The fastest way to get your results is to activate your My Chart account. Instructions are located on the last page of this paperwork. If you have not heard from [place] regarding the results in 2 weeks, please contact this office.     Remocin de la sutura, cuidados posteriores Suture Removal, Care After Lea esta informacin sobre cmo cuidarse despus del procedimiento. Su mdico tambin podr darle indicaciones ms especficas. Comunquese con su mdico si tiene problemas o preguntas. Qu puedo esperar despus del procedimiento? Despus de que le retiren los puntos (suturas), es comn tener lo siguiente:  Molestias e hinchazn en la zona.  Leve enrojecimiento en la zona de la herida. Siga estas indicaciones en su casa: Si tiene una venda:  Lvese las manos con agua y jabn antes de Multimedia programmer las vendas (vendajes). Use desinfectante para manos si no dispone de [place] y [place].  Cambie los vendajes como se lo haya indicado el mdico. Si el vendaje se moja, se ensucia o tiene mal olor, cmbielo tan pronto como pueda.  Si el vendaje se le pega en la piel, remjelo en agua tibia para aflojarlo. Cuidados de la Engineer, technical sales la herida todos  los [address]  [phone] para detectar signos de infeccin. Est atento a los siguientes signos: ? Aumento del enrojecimiento, de la hinchazn o del dolor. ? Lquido o sangre. ? Calor. ? Pus o mal olor.  Lvese las manos con agua y [place], antes y despus de tocarse la herida.  Solo aplique un ungento o crema segn las indicaciones del mdico. Si [place] crema o ungento, lave la zona con agua y [address] veces por da para quitarlo todo. Enjuague el jabn y seque suavemente la zona con una toalla limpia.  Si tiene goma para cerrar la piel o tiras [employer] sobre la herida, Psychologist, sport and exercise. Tal vez deban dejarse puestos en la piel durante 2semanas o ms tiempo. Si los bordes de las tiras [address] empiezan a despegarse y Scientific laboratory technician, puede recortar los que estn sueltos. No retire las tiras [employer] por completo a menos que el mdico se lo indique.  Mantenga la herida limpia y seca. No tome baos de inmersin, no nade ni use el jacuzzi hasta que el mdico lo autorice.  Contine protegiendo la herida de lesiones.  No se toque la herida. Esto puede causar una infeccin.  Una vez que la herida haya cicatrizado por completo, cbrala con pantalla solar o con una prenda cuando est al [place]. Las cicatrices se queman fcilmente con el sol, lo que puede Herbalist. Instrucciones generales  [employer] de venta libre y los recetados solamente como se lo haya indicado el mdico.  [place] a  todas las visitas de control como se lo haya indicado el mdico. Esto es importante. Comunquese con un mdico si:  Tiene enrojecimiento, hinchazn o dolor alrededor de la herida.  Observa lquido o sangre que salen de la herida.  La herida est caliente al tacto.  Tiene pus o percibe mal olor que emana de la herida.  La herida se abre. Solicite ayuda de inmediato si:  Tiene fiebre.  El enrojecimiento se expande desde la herida. Resumen  Despus de que le retiren los puntos, es comn  tener molestias e hinchazn en la zona.  Lvese las manos con agua y jabn antes de Quarry manager las vendas (vendajes).  Mantenga la herida limpia y seca. No tome baos de inmersin, no nade ni use el jacuzzi hasta que el mdico lo autorice. Esta informacin no tiene Marine scientist el consejo del mdico. Asegrese de hacerle al mdico cualquier pregunta que tenga. Document Released: 01/23/2005 Document Revised: 12/24/2016 Document Reviewed: 12/24/2016 Elsevier Patient Education  2020 [employer].

## 2019-05-04 ENCOUNTER — Telehealth: Payer: Self-pay | Admitting: Emergency Medicine

## 2019-05-04 NOTE — Telephone Encounter (Signed)
Copied from CRM [id number]. Topic: Medical Record Request - Other >> May 04, 2019  1:22 PM Wyonia Hough E wrote: Patient Name/DOB/MRN #: Mark Lawson / 21-Sep-1990/ MRN# [id number] Requestor Name/Agency: Patient  Call Back #: [phone] Information Requested: most recent OV notes faxed to fax# [phone] [facility]) Dr. Drue Novel   Route to [facility] HIM Pool for [place] clinics. For all other clinics, route to the clinic's PEC Pool.

## 2019-07-07 ENCOUNTER — Encounter: Payer: Self-pay | Admitting: Internal Medicine

## 2019-07-16 ENCOUNTER — Encounter: Payer: Self-pay | Admitting: Internal Medicine

## 2019-07-21 ENCOUNTER — Other Ambulatory Visit: Payer: Self-pay

## 2019-07-22 ENCOUNTER — Other Ambulatory Visit: Payer: Self-pay

## 2019-07-22 ENCOUNTER — Ambulatory Visit (INDEPENDENT_AMBULATORY_CARE_PROVIDER_SITE_OTHER): Payer: Managed Care, Other (non HMO) | Admitting: Internal Medicine

## 2019-07-22 VITALS — BP 118/72 | HR 59 | Temp 97.5°F | Resp 16 | Ht 67.0 in | Wt 156.2 lb

## 2019-07-22 DIAGNOSIS — M79605 Pain in left leg: Secondary | ICD-10-CM | POA: Diagnosis not present

## 2019-07-22 DIAGNOSIS — Z0000 Encounter for general adult medical examination without abnormal findings: Secondary | ICD-10-CM

## 2019-07-22 LAB — CBC WITH DIFFERENTIAL/PLATELET
Basophils Absolute: 0 10*3/uL (ref 0.0–0.1)
Basophils Relative: 0.5 % (ref 0.0–3.0)
Eosinophils Absolute: 0.2 10*3/uL (ref 0.0–0.7)
Eosinophils Relative: 3.9 % (ref 0.0–5.0)
HCT: 46.9 % (ref 39.0–52.0)
Hemoglobin: 16.2 g/dL (ref 13.0–17.0)
Lymphocytes Relative: 36.4 % (ref 12.0–46.0)
Lymphs Abs: 2.2 10*3/uL (ref 0.7–4.0)
MCHC: 34.5 g/dL (ref 30.0–36.0)
MCV: 93.1 fl (ref 78.0–100.0)
Monocytes Absolute: 0.5 10*3/uL (ref 0.1–1.0)
Monocytes Relative: 8.7 % (ref 3.0–12.0)
Neutro Abs: 3.1 10*3/uL (ref 1.4–7.7)
Neutrophils Relative %: 50.5 % (ref 43.0–77.0)
Platelets: 294 10*3/uL (ref 150.0–400.0)
RBC: 5.04 Mil/uL (ref 4.22–5.81)
RDW: 12.9 % (ref 11.5–15.5)
WBC: 6.1 10*3/uL (ref 4.0–10.5)

## 2019-07-22 LAB — COMPREHENSIVE METABOLIC PANEL
ALT: 27 U/L (ref 0–53)
AST: 19 U/L (ref 0–37)
Albumin: 4.8 g/dL (ref 3.5–5.2)
Alkaline Phosphatase: 45 U/L (ref 39–117)
BUN: 17 mg/dL (ref 6–23)
CO2: 30 mEq/L (ref 19–32)
Calcium: 9.7 mg/dL (ref 8.4–10.5)
Chloride: 102 mEq/L (ref 96–112)
Creatinine, Ser: 0.72 mg/dL (ref 0.40–1.50)
GFR: 130.46 mL/min (ref >60.00)
Glucose, Bld: 95 mg/dL (ref 70–99)
Potassium: 4.8 mEq/L (ref 3.5–5.1)
Sodium: 138 mEq/L (ref 135–145)
Total Bilirubin: 1 mg/dL (ref 0.2–1.2)
Total Protein: 7.5 g/dL (ref 6.0–8.3)

## 2019-07-22 LAB — LIPID PANEL
Cholesterol: 179 mg/dL (ref 0–200)
HDL: 75.9 mg/dL (ref >39.00)
LDL Cholesterol: 94 mg/dL (ref 0–99)
NonHDL: 103.1
Total CHOL/HDL Ratio: 2
Triglycerides: 46 mg/dL (ref 0.0–149.0)
VLDL: 9.2 mg/dL (ref 0.0–40.0)

## 2019-07-22 LAB — TSH: TSH: 1.56 u[IU]/mL (ref 0.35–4.50)

## 2019-07-22 NOTE — Patient Instructions (Signed)
Check with your insurance if they cover a immunization called  HPV  For  leg pain, remember to stretch before going to work   GO TO THE LAB : Get the blood work     GO TO THE FRONT DESK, please reschedule your appointments Come back for   a physical exam in 1 year   Testicular Self-Exam A self-examination of your testicles (testicular self-exam) involves looking at and feeling your testicles for abnormal lumps or swelling. Several things can cause swelling, lumps, or pain in your testicles. Some of these causes are:  Injuries.  Inflammation.  Infection.  Buildup of fluids around your testicle (hydrocele).  Twisted testicles (testicular torsion).  Testicular cancer. Why is it important to do a testicular self-exam? Self-examination of the testicles and the left and right groin areas may be recommended if you are at risk for testicular cancer. Your groin is where your lower abdomen meets your upper thighs. You may be at risk for testicular cancer if you have:  An undescended testicle (cryptorchidism).  A history of previous testicular cancer.  A family history of testicular cancer. How to do a testicular self-exam The testicles are easiest to examine after a warm bath or shower. They are more difficult to examine when you are cold. This is because the muscles attached to the testicles retract and pull them up higher or into the abdomen. A normal testicle is egg-shaped and feels firm. It is smooth and not tender. The spermatic cord can be felt as a firm, spaghetti-like cord at the back of your testicle. Look and feel for changes  Stand and hold your penis away from your body.  Look at each testicle to check for lumps or swelling.  Roll each testicle between your thumb and forefinger, feeling the entire testicle. Feel for: ? Lumps. ? Swelling. ? Discomfort.  Check the groin area between your abdomen and upper thighs on both sides of your body. Look and feel for any swelling  or bumps that are tender. These could be enlarged lymph nodes. Contact a health care provider if:  You find any bumps or lumps, such as a small, hard, pea-sized lump.  You find swelling, pain, or soreness.  You see or feel any other changes in your testicles. Summary  A self-examination of your testicles (testicular self-exam) involves looking at and feeling your testicles for any changes.  Self-examination of the testicles and the left and right groin areas may be recommended if you are at risk for testicular cancer.  You should check each of your testicles for lumps, swelling, or discomfort.  You should check for swelling or tender bumps in your groin area between your lower abdomen and upper thighs. This information is not intended to replace advice given to you by your health care provider. Make sure you discuss any questions you have with your health care provider. Document Revised: 08/06/2018 Document Reviewed: 03/11/2016 Elsevier Patient Education  2020 [employer].

## 2019-07-22 NOTE — Assessment & Plan Note (Signed)
Tdap 2020 HPV offered, recommend to check with insurance Recommend Covid vaccination when available Patient education: Diet, exercise, self testicular exam. Labs: FLP, CMP, CBC, TSH

## 2019-07-22 NOTE — Progress Notes (Signed)
Pre visit review using our clinic review tool, if applicable. No additional management support is needed unless otherwise documented below in the visit note. 

## 2019-07-22 NOTE — Progress Notes (Signed)
   Subjective:    Patient ID: Mark Lawson, male    DOB: [birth year], 29 y.o.   MRN: [id number]  DOS:  07/22/2019 Type of visit - description: New patient, request a CPX In general feels well. For a while is having pain at the inner aspect of the legs, more so on the left. Pain is typically when he tries to lift something or move a box/package  with his legs. He is concerned about a hernia however he denies any inguinal mass.    Review of Systems  Other than above, a 14 point review of systems is negative     Past Medical History:  Diagnosis Date  . Head injury    as a child  . Hyperlipidemia     Past Surgical History:  Procedure Laterality Date  . Head surgery     As a child, had a injury at the head and underwent surgery, no details    Family History  Problem Relation Age of Onset  . Healthy Mother   . Healthy Father   . Diabetes Maternal Grandmother   . Healthy Sister   . Healthy Sister   . CAD Neg Hx   . Cancer Neg Hx      Allergies as of 07/22/2019   No Known Allergies     Medication List    as of July 22, 2019 11:59 PM   You have not been prescribed any medications.        Objective:   Physical Exam BP 118/72 (BP Location: Left Arm, Patient Position: Sitting, Cuff Size: Small)   Pulse (!) 59   Temp (!) 97.5 F (36.4 C) (Temporal)   Resp 16   Ht 5\' 7"  (1.702 m)   Wt 156 lb 4 oz (70.9 kg)   SpO2 100%   BMI 24.47 kg/m  General: Well developed, NAD, BMI noted Neck: No  thyromegaly  HEENT:  Normocephalic . Face symmetric, atraumatic Lungs:  CTA B Normal respiratory effort, no intercostal retractions, no accessory muscle use. Heart: RRR,  no murmur.  Abdomen:  Not distended, soft, non-tender. No rebound or rigidity.  No inguinal hernias that I can tell. Lower extremities: no pretibial edema bilaterally  Skin: Exposed areas without rash. Not pale. Not jaundice GU: Scrotal contents normal, penis normal. MSK: Hips normal to  retention, no trochanteric tenderness Neurologic:  alert & oriented X3.  Speech normal, gait appropriate for age and unassisted Strength symmetric and appropriate for age.  Psych: Cognition and judgment appear intact.  Cooperative with normal attention span and concentration.  Behavior appropriate. No anxious or depressed appearing.     Assessment    Assessment: New patient 06-2019  Plan: Here for CPX Leg pain, complains of pain about the abductors with certain movements, he is already stretching daily and request further evaluation, refer to sports medicine.  On my exam there is no evidence of a hernia.  This visit occurred during the SARS-CoV-2 public health emergency.  Safety protocols were in place, including screening questions prior to the visit, additional usage of staff PPE, and extensive cleaning of exam room while observing appropriate contact time as indicated for disinfecting solutions.

## 2020-07-25 ENCOUNTER — Encounter: Payer: Self-pay | Admitting: Internal Medicine

## 2020-07-25 ENCOUNTER — Encounter: Payer: Managed Care, Other (non HMO) | Admitting: Internal Medicine

## 2021-07-13 ENCOUNTER — Encounter: Payer: Self-pay | Admitting: Internal Medicine

## 2023-06-06 ENCOUNTER — Telehealth: Payer: Self-pay | Admitting: Internal Medicine

## 2023-06-06 NOTE — Telephone Encounter (Signed)
 Copied from CRM [id number]. Topic: General - Other >> Jun 06, 2023  3:58 PM Franky GRADE wrote: Reason for CRM: Patient is a previous patient of Dr.Paz last appointment was 07/18/2019, advised patient that he would be a new patient due to the 3 year rule and that at the moment Dr.Paz was not accepting new patients. He would like to know if Dr.Paz would consider taking him on as a patient again. Patient's best call back number [phone].

## 2023-06-07 NOTE — Telephone Encounter (Signed)
 Ok to schedule.

## 2023-07-28 ENCOUNTER — Ambulatory Visit: Payer: Managed Care, Other (non HMO) | Admitting: Internal Medicine

## 2023-09-01 ENCOUNTER — Ambulatory Visit: Admitting: Internal Medicine
